# Patient Record
Sex: Male | Born: 1965
Health system: Southern US, Community
[De-identification: ages and names within clinical notes are randomized; demographics above are authoritative.]

## PROBLEM LIST (undated history)

## (undated) DIAGNOSIS — E785 Hyperlipidemia, unspecified: Secondary | ICD-10-CM

## (undated) DIAGNOSIS — Z86010 Personal history of colonic polyps: Secondary | ICD-10-CM

## (undated) HISTORY — DX: Gilbert syndrome: E80.4

## (undated) HISTORY — DX: Personal history of colonic polyps: Z86.010

## (undated) HISTORY — DX: Hyperlipidemia, unspecified: E78.5

---
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
## 1998-03-22 HISTORY — PX: VASECTOMY: SHX75

## 2009-08-28 ENCOUNTER — Ambulatory Visit: Payer: Self-pay | Admitting: Family Medicine

## 2009-08-30 DIAGNOSIS — E785 Hyperlipidemia, unspecified: Secondary | ICD-10-CM | POA: Insufficient documentation

## 2009-08-30 LAB — CONVERTED CEMR LAB
ALT: 41 units/L (ref 0–53)
AST: 28 units/L (ref 0–37)
Albumin: 4.6 g/dL (ref 3.5–5.2)
Alkaline Phosphatase: 52 units/L (ref 39–117)
BUN: 9 mg/dL (ref 6–23)
Basophils Absolute: 0 10*3/uL (ref 0.0–0.1)
Basophils Relative: 0.4 % (ref 0.0–3.0)
Bilirubin, Direct: 0.3 mg/dL (ref 0.0–0.3)
CO2: 31 meq/L (ref 19–32)
Calcium: 9.5 mg/dL (ref 8.4–10.5)
Chloride: 101 meq/L (ref 96–112)
Cholesterol: 233 mg/dL — ABNORMAL HIGH (ref 0–200)
Creatinine, Ser: 0.9 mg/dL (ref 0.4–1.5)
Direct LDL: 155.7 mg/dL
Eosinophils Absolute: 0.1 10*3/uL (ref 0.0–0.7)
Eosinophils Relative: 1.6 % (ref 0.0–5.0)
GFR calc non Af Amer: 96.82 mL/min (ref >60)
Glucose, Bld: 87 mg/dL (ref 70–99)
HCT: 44.9 % (ref 39.0–52.0)
HDL: 43.2 mg/dL (ref >39.00)
Hemoglobin: 16 g/dL (ref 13.0–17.0)
Lymphocytes Relative: 38.4 % (ref 12.0–46.0)
Lymphs Abs: 1.7 10*3/uL (ref 0.7–4.0)
MCHC: 35.8 g/dL (ref 30.0–36.0)
MCV: 90 fL (ref 78.0–100.0)
Monocytes Absolute: 0.3 10*3/uL (ref 0.1–1.0)
Monocytes Relative: 6.4 % (ref 3.0–12.0)
Neutro Abs: 2.4 10*3/uL (ref 1.4–7.7)
Neutrophils Relative %: 53.2 % (ref 43.0–77.0)
PSA: 0.46 ng/mL (ref 0.10–4.00)
Platelets: 144 10*3/uL — ABNORMAL LOW (ref 150.0–400.0)
Potassium: 3.9 meq/L (ref 3.5–5.1)
RBC: 4.98 M/uL (ref 4.22–5.81)
RDW: 12.6 % (ref 11.5–14.6)
Sodium: 141 meq/L (ref 135–145)
Total Bilirubin: 3 mg/dL — ABNORMAL HIGH (ref 0.3–1.2)
Total CHOL/HDL Ratio: 5
Total Protein: 6.8 g/dL (ref 6.0–8.3)
Triglycerides: 194 mg/dL — ABNORMAL HIGH (ref 0.0–149.0)
VLDL: 38.8 mg/dL (ref 0.0–40.0)
Vit D, 25-Hydroxy: 27 ng/mL — ABNORMAL LOW (ref 30–89)
WBC: 4.5 10*3/uL (ref 4.5–10.5)

## 2009-09-11 ENCOUNTER — Telehealth: Payer: Self-pay | Admitting: Family Medicine

## 2009-12-26 ENCOUNTER — Ambulatory Visit: Payer: Self-pay | Admitting: Family Medicine

## 2009-12-29 LAB — CONVERTED CEMR LAB
ALT: 76 units/L — ABNORMAL HIGH (ref 0–53)
AST: 45 units/L — ABNORMAL HIGH (ref 0–37)
Albumin: 4.2 g/dL (ref 3.5–5.2)
Alkaline Phosphatase: 52 units/L (ref 39–117)
Bilirubin, Direct: 0.3 mg/dL (ref 0.0–0.3)
Cholesterol: 168 mg/dL (ref 0–200)
HDL: 43.4 mg/dL (ref >39.00)
LDL Cholesterol: 92 mg/dL (ref 0–99)
Total Bilirubin: 2.2 mg/dL — ABNORMAL HIGH (ref 0.3–1.2)
Total CHOL/HDL Ratio: 4
Total Protein: 6.7 g/dL (ref 6.0–8.3)
Triglycerides: 164 mg/dL — ABNORMAL HIGH (ref 0.0–149.0)
VLDL: 32.8 mg/dL (ref 0.0–40.0)

## 2010-04-01 ENCOUNTER — Ambulatory Visit: Admit: 2010-04-01 | Payer: Self-pay | Admitting: Family Medicine

## 2010-04-07 ENCOUNTER — Other Ambulatory Visit: Payer: Self-pay | Admitting: Family Medicine

## 2010-04-07 ENCOUNTER — Ambulatory Visit
Admission: RE | Admit: 2010-04-07 | Discharge: 2010-04-07 | Payer: Self-pay | Source: Home / Self Care | Attending: Family Medicine | Admitting: Family Medicine

## 2010-04-07 LAB — LIPID PANEL
Cholesterol: 222 mg/dL — ABNORMAL HIGH (ref 0–200)
HDL: 35.6 mg/dL — ABNORMAL LOW (ref >39.00)
Total CHOL/HDL Ratio: 6
Triglycerides: 262 mg/dL — ABNORMAL HIGH (ref 0.0–149.0)
VLDL: 52.4 mg/dL — ABNORMAL HIGH (ref 0.0–40.0)

## 2010-04-07 LAB — ALT: ALT: 32 U/L (ref 0–53)

## 2010-04-07 LAB — AST: AST: 26 U/L (ref 0–37)

## 2010-04-07 LAB — LDL CHOLESTEROL, DIRECT: Direct LDL: 150.6 mg/dL

## 2010-04-21 NOTE — Assessment & Plan Note (Signed)
Summary: NEW PT  [facility]  CYD/per dr Umberto Pavek   Vital Signs:  Patient profile:   45 year old male Height:      72 inches Weight:      219.50 pounds BMI:     29.88 Temp:     97.8 degrees F oral Pulse rate:   80 / minute Pulse rhythm:   regular BP sitting:   104 / 80  (left arm) Cuff size:   regular  Vitals Entered By: Delilah Shan CMA Duncan Dull) (August 28, 2009 12:10 PM) CC: New Patient to Establish   History of Present Illness: 45 year old male:  New pt to est and for CPX  Preventive Screening-Counseling & Management  Alcohol-Tobacco     Alcohol drinks/day: 1     Alcohol Counseling: not indicated; use of alcohol is not excessive or problematic     Smoking Status: never     Tobacco Counseling: not indicated; no tobacco use  Caffeine-Diet-Exercise     Diet Counseling: to improve diet; diet is suboptimal     Does Patient Exercise: yes     Exercise Counseling: to improve exercise regimen  Hep-HIV-STD-Contraception     HIV Risk: no risk noted     STD Risk: no risk noted     Contraception Counseling: not indicated; no questions/concerns expressed     Dental Visit-last 6 months yes     Testicular SE Education/Counseling to perform regular STE      Sexual History:  currently monogamous.        Drug Use:  never and no.    Allergies (verified): No Known Drug Allergies  Past History:  Past Medical History: Hyperlipidemia  Family History: + Lipids, HTN  Social History: Married to [employer] Never Smoked Alcohol use-yes Drug use-no Regular exercise-yes Smoking Status:  never Drug Use:  never, no Does Patient Exercise:  yes HIV Risk:  no risk noted STD Risk:  no risk noted Dental Care w/in 6 mos.:  yes Sexual History:  currently monogamous  Review of Systems  General: Denies fever, chills, sweats, anorexia, fatigue, weakness, malaise Eyes: Denies blurring, vision loss ENT: Denies earache, nasal congestion, nosebleeds, sore throat, and  hoarseness.  Cardiovascular: Denies chest pains, palpitations, syncope, dyspnea on exertion,  Respiratory: Denies cough, dyspnea at rest, excessive sputum,wheeezing GI: Denies nausea, vomiting, diarrhea, constipation, change in bowel habits, abdominal pain, melena, hematochezia GU: Denies dysuria, hematuria, discharge, urinary frequency, urinary hesitancy, nocturia, incontinence, genital sores, decreased libido Musculoskeletal: Denies back pain, joint pain Derm: Denies rash, itching Neuro: Denies  paresthesias, frequent falls, frequent headaches, and difficulty walking.  Psych: Denies depression, anxiety Endocrine: Denies cold intolerance, heat intolerance, polydipsia, polyphagia, polyuria, and unusual weight change.  Heme: Denies enlarged lymph nodes Allergy: No hayfever   Otherwise, the pertinent positives and negatives are listed above and in the HPI, otherwise a full review of systems has been reviewed and is negative unless noted positive.    Impression & Recommendations:  Problem # 1:  HEALTH MAINTENANCE EXAM (ICD-V70.0) The patient's preventative maintenance and recommended screening tests for an annual wellness exam were reviewed in full today. Brought up to date unless services declined.  Counselled on the importance of diet, exercise, and its role in overall health and mortality. The patient's FH and SH was reviewed, including their home life, tobacco status, and drug and alcohol status.   Doing well. Work on [employer], diet, and exercise.  Other Orders: Venipuncture (81191) TLB-Lipid Panel (80061-LIPID) TLB-BMP (Basic Metabolic  Panel-BMET) (80048-METABOL) TLB-CBC Platelet - w/Differential (85025-CBCD) TLB-Hepatic/Liver Function Pnl (80076-HEPATIC) TLB-PSA (Prostate Specific Antigen) (84153-PSA) T-Vitamin D (25-Hydroxy) ([address]) Specimen Handling (09811)  Prior Medications (reviewed today): None Current Allergies (reviewed today): No known allergies   Physical  Exam General Appearance: well developed, well nourished, no acute distress Eyes: conjunctiva and lids normal, PERRLA, EOMI Ears, Nose, Mouth, Throat: TM clear, nares clear, oral exam WNL Neck: supple, no lymphadenopathy, no thyromegaly, no JVD Respiratory: clear to auscultation and percussion, respiratory effort normal Cardiovascular: regular rate and rhythm, S1-S2, no murmur, rub or gallop, no bruits, peripheral pulses normal and symmetric, no cyanosis, clubbing, edema or varicosities Chest: no scars, masses, tenderness; no asymmetry, skin changes, nipple discharge, no gynecomastia   Gastrointestinal: soft, non-tender; no hepatosplenomegaly, masses; active bowel sounds all quadrants,  no masses, tenderness, hemorrhoids  Genitourinary: no hernia, testicular mass, penile discharge, priapism or prostate enlargement Lymphatic: no cervical, axillary or inguinal adenopathy Musculoskeletal: gait normal, muscle tone and strength WNL, no joint swelling, effusions, discoloration, crepitus  Skin: clear, good turgor, color WNL, no rashes, lesions, or ulcerations Neurologic: normal mental status, normal reflexes, normal strength, sensation, and motion Psychiatric: alert; oriented to person, place and time Other Exam:

## 2010-04-21 NOTE — Progress Notes (Signed)
Summary: chol med   Phone Note Call from Patient Call back at Home Phone [phone]   Caller: Patient Call For: Hannah Beat MD Summary of Call: Patient called to let you know that he would like his cholesterol med sent to [facility].  Initial call taken by: Melody Comas,  September 11, 2009 10:17 AM  Follow-up for Phone Call        3 months will need lab appt FLP, HFP: 272.4 Follow-up by: Hannah Beat MD,  September 11, 2009 2:30 PM  Additional Follow-up for Phone Call Additional follow up Details #1::        appt made 12-16-2009 at 9:05 am Additional Follow-up by: Benny Lennert CMA Duncan Dull),  September 15, 2009 9:03 AM    New/Updated Medications: SIMVASTATIN 40 MG TABS (SIMVASTATIN) 1 by mouth at bedtime Prescriptions: SIMVASTATIN 40 MG TABS (SIMVASTATIN) 1 by mouth at bedtime  #30 x 5   Entered and Authorized by:   Hannah Beat MD   Signed by:   Hannah Beat MD on 09/11/2009   Method used:   Electronically to        [facility]* (retail)       [address]       [place], [place]  [id number]       Ph: [phone]       Fax: [phone]   RxID:   [id number]

## 2010-05-14 ENCOUNTER — Encounter: Payer: Self-pay | Admitting: Family Medicine

## 2010-05-14 ENCOUNTER — Ambulatory Visit (INDEPENDENT_AMBULATORY_CARE_PROVIDER_SITE_OTHER): Payer: 59 | Admitting: Family Medicine

## 2010-05-14 DIAGNOSIS — E785 Hyperlipidemia, unspecified: Secondary | ICD-10-CM

## 2010-05-19 NOTE — Assessment & Plan Note (Signed)
Summary: discuss cholesterol /alc   Vital Signs:  Patient profile:   45 year old male Weight:      222 pounds Temp:     98.8 degrees F oral Pulse rate:   66 / minute Pulse rhythm:   regular BP sitting:   110 / 68  (left arm) Cuff size:   large  Vitals Entered By: Mervin Hack CMA Duncan Dull) (May 14, 2010 9:18 AM)  CC: discuss Lipids   History of Present Illness:  45 yo f/u cholesterol  reviewed panel with him had to cycle off zocor, elevated LFT's, now returned to normal feeling well now. not at goal  REVIEW OF SYSTEMS GEN: No acute illnesses, no fever, chills, sweats. CV: No chest pain or SOB Otherwise, pertinent positives and negatives are noted in the HPI.   GEN: WDWN, NAD, Non-toxic, A & O x 3 HEENT: Atraumatic, Normocephalic. Neck supple. No masses, No LAD. Ears and Nose: No external deformity. CV: RRR, No M/G/R. No JVD. No thrill. No extra heart sounds. PULM: CTA B, no wheezes, crackles, rhonchi. No retractions. No resp. distress. No accessory muscle use. EXTR: No c/c/e NEURO: Normal gait.  PSYCH: Normally interactive. Conversant. Not depressed or anxious appearing.  Calm demeanor.    Clinical Review Panels:  Lipid Management   Cholesterol:  222 (04/07/2010)   LDL (bad choesterol):  92 (12/26/2009)   HDL (good cholesterol):  35.60 (04/07/2010)  Complete Metabolic Panel   Glucose:  87 (08/28/2009)   Sodium:  141 (08/28/2009)   Potassium:  3.9 (08/28/2009)   Chloride:  101 (08/28/2009)   CO2:  31 (08/28/2009)   BUN:  9 (08/28/2009)   Creatinine:  0.9 (08/28/2009)   Albumin:  4.2 (12/26/2009)   Total Protein:  6.7 (12/26/2009)   Calcium:  9.5 (08/28/2009)   Total Bili:  2.2 (12/26/2009)   Alk Phos:  52 (12/26/2009)   SGPT (ALT):  32 (04/07/2010)   SGOT (AST):  26 (04/07/2010)   Allergies: No Known Drug Allergies  Past History:  Past medical, surgical, family and social histories (including risk factors) reviewed, and no changes noted (except  as noted below).  Past Medical History: Reviewed history from 08/28/2009 and no changes required. Hyperlipidemia  Family History: Reviewed history from 08/28/2009 and no changes required. Family History Diabetes 1st degree relative, parents Family History High cholesterol, Parents, grandparents Family History Hypertension, parents and grandparents Heart Disease, grandparent Emotional/Mental Illness, parents  Social History: Reviewed history from 08/28/2009 and no changes required. Marital Status: Married Children:  Occupation: Art gallery manager Never Smoked Alcohol use-yes Drug use-no Regular exercise-no   Impression & Recommendations:  Problem # 1:  HYPERLIPIDEMIA (ICD-272.4)  start lower dose lipitor   The following medications were removed from the medication list:    Simvastatin 40 Mg Tabs (Simvastatin) .Marland Kitchen... 1 by mouth at bedtime His updated medication list for this problem includes:    Atorvastatin Calcium 20 Mg Tabs (Atorvastatin calcium) .Marland Kitchen... 1 by mouth at bedtime  Labs Reviewed: SGOT: 26 (04/07/2010)   SGPT: 32 (04/07/2010)   HDL:35.60 (04/07/2010), 43.40 (12/26/2009)  LDL:92 (12/26/2009)  Chol:222 (04/07/2010), 168 (12/26/2009)  Trig:262.0 (04/07/2010), 164.0 (12/26/2009)  Complete Medication List: 1)  Atorvastatin Calcium 20 Mg Tabs (Atorvastatin calcium) .Marland Kitchen.. 1 by mouth at bedtime  Patient Instructions: 1)  f/u lab visit: 2)  FLP, 272.4 3)  HFP: v58.69 Prescriptions: ATORVASTATIN CALCIUM 20 MG TABS (ATORVASTATIN CALCIUM) 1 by mouth at bedtime  #30 x 5   Entered and Authorized by:   Karleen Hampshire Butler Vegh  MD   Signed by:   Hannah Beat MD on 05/14/2010   Method used:   Electronically to        [facility]* (retail)       [address]       [place], [place]  [id number]       Ph: [phone]       Fax: [phone]   RxID:   [id number]    Orders Added: 1)  Est. Patient Level III [24401]    Prior Medications: Current Allergies  (reviewed today): No known allergies

## 2010-12-14 ENCOUNTER — Other Ambulatory Visit: Payer: Self-pay | Admitting: *Deleted

## 2010-12-14 MED ORDER — ATORVASTATIN CALCIUM 20 MG PO TABS: 20.0000 mg | ORAL_TABLET | Freq: Every day | ORAL | Status: DC

## 2010-12-17 ENCOUNTER — Encounter: Payer: Self-pay | Admitting: Family Medicine

## 2010-12-21 ENCOUNTER — Other Ambulatory Visit (INDEPENDENT_AMBULATORY_CARE_PROVIDER_SITE_OTHER): Payer: 59

## 2010-12-21 DIAGNOSIS — Z125 Encounter for screening for malignant neoplasm of prostate: Secondary | ICD-10-CM

## 2010-12-21 DIAGNOSIS — Z79899 Other long term (current) drug therapy: Secondary | ICD-10-CM

## 2010-12-21 DIAGNOSIS — E785 Hyperlipidemia, unspecified: Secondary | ICD-10-CM

## 2010-12-21 LAB — COMPREHENSIVE METABOLIC PANEL
Albumin: 4.5 g/dL (ref 3.5–5.2)
CO2: 30 mEq/L (ref 19–32)
Calcium: 9.2 mg/dL (ref 8.4–10.5)
GFR: 82.38 mL/min (ref >60.00)
Glucose, Bld: 95 mg/dL (ref 70–99)
Sodium: 141 mEq/L (ref 135–145)
Total Bilirubin: 2.8 mg/dL — ABNORMAL HIGH (ref 0.3–1.2)
Total Protein: 7.1 g/dL (ref 6.0–8.3)

## 2010-12-21 LAB — CBC WITH DIFFERENTIAL/PLATELET
Eosinophils Relative: 2.2 % (ref 0.0–5.0)
HCT: 44.3 % (ref 39.0–52.0)
Lymphocytes Relative: 35.8 % (ref 12.0–46.0)
Monocytes Relative: 6.2 % (ref 3.0–12.0)
Neutrophils Relative %: 55.4 % (ref 43.0–77.0)
Platelets: 133 10*3/uL — ABNORMAL LOW (ref 150.0–400.0)
WBC: 4.2 10*3/uL — ABNORMAL LOW (ref 4.5–10.5)

## 2010-12-21 LAB — PSA: PSA: 0.79 ng/mL (ref 0.10–4.00)

## 2010-12-21 LAB — LIPID PANEL
HDL: 44.2 mg/dL (ref >39.00)
Triglycerides: 93 mg/dL (ref 0.0–149.0)

## 2010-12-23 ENCOUNTER — Encounter: Payer: Self-pay | Admitting: Family Medicine

## 2010-12-23 ENCOUNTER — Ambulatory Visit (INDEPENDENT_AMBULATORY_CARE_PROVIDER_SITE_OTHER): Payer: 59 | Admitting: Family Medicine

## 2010-12-23 VITALS — BP 120/70 | HR 62 | Temp 97.9°F | Ht 72.0 in | Wt 219.1 lb

## 2010-12-23 DIAGNOSIS — Z0000 Encounter for general adult medical examination without abnormal findings: Secondary | ICD-10-CM

## 2010-12-23 MED ORDER — ATORVASTATIN CALCIUM 20 MG PO TABS: 20.0000 mg | ORAL_TABLET | Freq: Every day | ORAL | Status: DC

## 2010-12-23 NOTE — Patient Instructions (Signed)
Recheck liver panel in 6 months  Otherwise CPX in a year

## 2010-12-23 NOTE — Progress Notes (Signed)
Subjective:    Patient ID: Timothy Rollins, male    DOB: Oct 01, 1965, 45 y.o.   MRN: [id number]  HPI  Breslin Hemann, a 45 y.o. male presents today in the office for the following:    Preventative Health Maintenance Visit:  Health Maintenance Summary Reviewed and updated, unless pt declines services.  Tobacco History Reviewed. Alcohol: approx 1-3 a night, some variety, from 0 to 4  Exercise Habits: Some activity, rec at least 30 mins 5 times a week - minimal right now STD concerns: no risk or activity to increase risk Drug Use: None Encouraged self-testicular check  Health Maintenance  Topic Date Due  . Tetanus/tdap  04/11/1983  . Influenza Vaccine  12/21/2010    Labs reviewed with the patient.   Lipids:    Component Value Date/Time   CHOL 145 12/21/2010 1024   TRIG 93.0 12/21/2010 1024   HDL 44.20 12/21/2010 1024   LDLDIRECT 150.6 04/07/2010 0905   VLDL 18.6 12/21/2010 1024   CHOLHDL 3 12/21/2010 1024    CBC:    Component Value Date/Time   WBC 4.2* 12/21/2010 1024   HGB 15.3 12/21/2010 1024   HCT 44.3 12/21/2010 1024   PLT 133.0* 12/21/2010 1024   MCV 92.3 12/21/2010 1024   NEUTROABS 2.3 12/21/2010 1024   LYMPHSABS 1.5 12/21/2010 1024   MONOABS 0.3 12/21/2010 1024   EOSABS 0.1 12/21/2010 1024   BASOSABS 0.0 12/21/2010 1024    Basic Metabolic Panel:    Component Value Date/Time   NA 141 12/21/2010 1024   K 4.1 12/21/2010 1024   CL 104 12/21/2010 1024   CO2 30 12/21/2010 1024   BUN 15 12/21/2010 1024   CREATININE 1.0 12/21/2010 1024   GLUCOSE 95 12/21/2010 1024   CALCIUM 9.2 12/21/2010 1024    Lab Results  Component Value Date   ALT 56* 12/21/2010   AST 39* 12/21/2010   ALKPHOS 56 12/21/2010   BILITOT 2.8* 12/21/2010    Lab Results  Component Value Date   PSA 0.79 12/21/2010   PSA 0.46 08/28/2009   Doing well   The PMH, PSH, Social History, Family History, Medications, and allergies have been reviewed in [facility], and have been updated if relevant.   Review of  Systems  General: Denies fever, chills, sweats. No significant weight loss. Eyes: Denies blurring,significant itching ENT: Denies earache, sore throat, and hoarseness. Cardiovascular: Denies chest pains, palpitations, dyspnea on exertion Respiratory: Denies cough, dyspnea at rest,wheeezing Breast: no concerns about lumps GI: Denies nausea, vomiting, diarrhea, constipation, change in bowel habits, abdominal pain, melena, hematochezia GU: Denies penile discharge, ED, urinary flow / outflow problems. No STD concerns. Musculoskeletal: Denies back pain, joint pain Derm: Denies rash, itching Neuro: Denies  paresthesias, frequent falls, frequent headaches Psych: Denies depression, anxiety Endocrine: Denies cold intolerance, heat intolerance, polydipsia Heme: Denies enlarged lymph nodes Allergy: No hayfever     Objective:   Physical Exam   Physical Exam  Blood pressure 120/70, pulse 62, temperature 97.9 F (36.6 C), temperature source Oral, height 6' (1.829 m), weight 219 lb 1.9 oz (99.392 kg), SpO2 98.00%.  PE: GEN: well developed, well nourished, no acute distress Eyes: conjunctiva and lids normal, PERRLA, EOMI ENT: TM clear, nares clear, oral exam WNL Neck: supple, no lymphadenopathy, no thyromegaly, no JVD Pulm: clear to auscultation and percussion, respiratory effort normal CV: regular rate and rhythm, S1-S2, no murmur, rub or gallop, no bruits, peripheral pulses normal and symmetric, no cyanosis, clubbing, edema or varicosities Chest: no scars, masses, no  gynecomastia   GI: soft, non-tender; no hepatosplenomegaly, masses; active bowel sounds all quadrants GU: no hernia, testicular mass, penile discharge, priapism or prostate enlargement Lymph: no cervical, axillary or inguinal adenopathy MSK: gait normal, muscle tone and strength WNL, no joint swelling, effusions, discoloration, crepitus  SKIN: clear, good turgor, color WNL, no rashes, lesions, or ulcerations Neuro: normal  mental status, normal strength, sensation, and motion Psych: alert; oriented to person, place and time, normally interactive and not anxious or depressed in appearance.       Assessment & Plan:   1. Routine general medical examination at a health care facility    2. Elevated transaminase level  Hepatic function panel    The patient's preventative maintenance and recommended screening tests for an annual wellness exam were reviewed in full today. Brought up to date unless services declined.  Counselled on the importance of diet, exercise, and its role in overall health and mortality. The patient's FH and SH was reviewed, including their home life, tobacco status, and drug and alcohol status.  2. Lipids are at goal. Family history cardiac disease. I would continue with the Lipitor 20 mg. Minimally elevated AST and ALT. I have asked him to decrease his alcohol intake and we will recheck his liver function in 6 months.

## 2010-12-24 ENCOUNTER — Encounter: Payer: Self-pay | Admitting: Family Medicine

## 2011-06-30 ENCOUNTER — Other Ambulatory Visit: Payer: 59

## 2011-07-07 ENCOUNTER — Other Ambulatory Visit: Payer: 59

## 2011-07-21 ENCOUNTER — Other Ambulatory Visit (INDEPENDENT_AMBULATORY_CARE_PROVIDER_SITE_OTHER): Payer: 59

## 2011-07-21 LAB — HEPATIC FUNCTION PANEL
AST: 29 U/L (ref 0–37)
Alkaline Phosphatase: 44 U/L (ref 39–117)
Bilirubin, Direct: 0.3 mg/dL (ref 0.0–0.3)
Total Bilirubin: 2.5 mg/dL — ABNORMAL HIGH (ref 0.3–1.2)

## 2011-07-22 ENCOUNTER — Encounter: Payer: Self-pay | Admitting: *Deleted

## 2012-01-13 ENCOUNTER — Other Ambulatory Visit: Payer: Self-pay | Admitting: *Deleted

## 2012-01-13 MED ORDER — ATORVASTATIN CALCIUM 20 MG PO TABS: 20.0000 mg | ORAL_TABLET | Freq: Every day | ORAL | Status: DC

## 2012-11-14 ENCOUNTER — Other Ambulatory Visit: Payer: Self-pay

## 2012-11-14 NOTE — Telephone Encounter (Signed)
Pt left v/m has CPX scheduled 01/17/13; pt took last atorvastatin 11/13/12. Pt request refill until CPX; pt did not leave pharmacy. Left v/m for pt to cb.

## 2012-11-15 MED ORDER — ATORVASTATIN CALCIUM 20 MG PO TABS: 20.0000 mg | ORAL_TABLET | Freq: Every day | ORAL | Status: DC

## 2012-11-15 NOTE — Telephone Encounter (Signed)
Pt called back using [facility] advised pt done.

## 2013-01-17 ENCOUNTER — Encounter: Payer: Self-pay | Admitting: *Deleted

## 2013-01-17 ENCOUNTER — Ambulatory Visit (INDEPENDENT_AMBULATORY_CARE_PROVIDER_SITE_OTHER): Payer: 59 | Admitting: Family Medicine

## 2013-01-17 ENCOUNTER — Encounter: Payer: Self-pay | Admitting: Family Medicine

## 2013-01-17 VITALS — BP 110/74 | HR 56 | Temp 98.0°F | Ht 71.5 in | Wt 219.5 lb

## 2013-01-17 DIAGNOSIS — Z0000 Encounter for general adult medical examination without abnormal findings: Secondary | ICD-10-CM

## 2013-01-17 DIAGNOSIS — Z131 Encounter for screening for diabetes mellitus: Secondary | ICD-10-CM

## 2013-01-17 DIAGNOSIS — Z79899 Other long term (current) drug therapy: Secondary | ICD-10-CM

## 2013-01-17 DIAGNOSIS — E785 Hyperlipidemia, unspecified: Secondary | ICD-10-CM

## 2013-01-17 DIAGNOSIS — Z125 Encounter for screening for malignant neoplasm of prostate: Secondary | ICD-10-CM

## 2013-01-17 DIAGNOSIS — Z23 Encounter for immunization: Secondary | ICD-10-CM

## 2013-01-17 LAB — CBC WITH DIFFERENTIAL/PLATELET
Basophils Absolute: 0 10*3/uL (ref 0.0–0.1)
Basophils Relative: 0.4 % (ref 0.0–3.0)
Eosinophils Absolute: 0.1 10*3/uL (ref 0.0–0.7)
Eosinophils Relative: 1.5 % (ref 0.0–5.0)
HCT: 44.8 % (ref 39.0–52.0)
Hemoglobin: 15.8 g/dL (ref 13.0–17.0)
Lymphocytes Relative: 38.7 % (ref 12.0–46.0)
Lymphs Abs: 1.8 10*3/uL (ref 0.7–4.0)
MCHC: 35.3 g/dL (ref 30.0–36.0)
MCV: 89.1 fl (ref 78.0–100.0)
Monocytes Absolute: 0.3 10*3/uL (ref 0.1–1.0)
Neutro Abs: 2.4 10*3/uL (ref 1.4–7.7)
Neutrophils Relative %: 52.5 % (ref 43.0–77.0)
Platelets: 149 10*3/uL — ABNORMAL LOW (ref 150.0–400.0)
RBC: 5.03 Mil/uL (ref 4.22–5.81)
RDW: 12.6 % (ref 11.5–14.6)
WBC: 4.6 10*3/uL (ref 4.5–10.5)

## 2013-01-17 LAB — LIPID PANEL
Cholesterol: 162 mg/dL (ref 0–200)
HDL: 47.3 mg/dL (ref >39.00)
Total CHOL/HDL Ratio: 3
Triglycerides: 151 mg/dL — ABNORMAL HIGH (ref 0.0–149.0)

## 2013-01-17 LAB — BASIC METABOLIC PANEL
CO2: 31 mEq/L (ref 19–32)
Calcium: 9.6 mg/dL (ref 8.4–10.5)
Chloride: 101 mEq/L (ref 96–112)
Creatinine, Ser: 1 mg/dL (ref 0.4–1.5)
Glucose, Bld: 99 mg/dL (ref 70–99)

## 2013-01-17 LAB — PSA: PSA: 0.83 ng/mL (ref 0.10–4.00)

## 2013-01-17 LAB — HEPATIC FUNCTION PANEL
ALT: 44 U/L (ref 0–53)
Albumin: 4.5 g/dL (ref 3.5–5.2)
Alkaline Phosphatase: 49 U/L (ref 39–117)
Bilirubin, Direct: 0.3 mg/dL (ref 0.0–0.3)
Total Protein: 7.2 g/dL (ref 6.0–8.3)

## 2013-01-17 NOTE — Progress Notes (Signed)
Date:  01/17/2013   Name:  Timothy Rollins   DOB:  [birth year]   MRN:  [id number] Gender: male Age: 47 y.o.  Primary Physician:  Hannah Beat, MD   Chief Complaint: Annual Exam   History of Present Illness:  Timothy Rollins is a 47 y.o. very pleasant male patient who presents with the following:  Preventative Health Maintenance Visit:  Health Maintenance Summary Reviewed and updated, unless pt declines services.  Tobacco History Reviewed. Alcohol: No concerns, no excessive use Exercise Habits: Some activity, rec at least 30 mins 5 times a week STD concerns: no risk or activity to increase risk Drug Use: None Encouraged self-testicular check  Health Maintenance  Topic Date Due  . Tetanus/tdap  04/11/1983  . Influenza Vaccine  10/20/2012    Past Medical History, Surgical History, Social History, Family History, Problem List, Medications, and Allergies have been reviewed and updated if relevant.  Current Outpatient Prescriptions on File Prior to Visit  Medication Sig Dispense Refill  . atorvastatin (LIPITOR) 20 MG tablet Take 1 tablet (20 mg total) by mouth daily.  30 tablet  2   No current facility-administered medications on file prior to visit.    Review of Systems:  General: Denies fever, chills, sweats. No significant weight loss. Eyes: Denies blurring,significant itching ENT: Denies earache, sore throat, and hoarseness. Cardiovascular: Denies chest pains, palpitations, dyspnea on exertion Respiratory: Denies cough, dyspnea at rest,wheeezing Breast: no concerns about lumps GI: Denies nausea, vomiting, diarrhea, constipation, change in bowel habits, abdominal pain, melena, hematochezia GU: Denies penile discharge, ED, urinary flow / outflow problems. No STD concerns. Musculoskeletal: Denies back pain, joint pain Derm: Denies rash, itching Neuro: Denies  paresthesias, frequent falls, frequent headaches Psych: Denies depression, anxiety Endocrine: Denies cold  intolerance, heat intolerance, polydipsia Heme: Denies enlarged lymph nodes Allergy: No hayfever  Physical Examination: BP 110/74  Pulse 56  Temp(Src) 98 F (36.7 C) (Oral)  Ht 5' 11.5" (1.816 m)  Wt 219 lb 8 oz (99.565 kg)  BMI 30.19 kg/m2  GEN: well developed, well nourished, no acute distress Eyes: conjunctiva and lids normal, PERRLA, EOMI ENT: TM clear, nares clear, oral exam WNL Neck: supple, no lymphadenopathy, no thyromegaly, no JVD Pulm: clear to auscultation and percussion, respiratory effort normal CV: regular rate and rhythm, S1-S2, no murmur, rub or gallop, no bruits, peripheral pulses normal and symmetric, no cyanosis, clubbing, edema or varicosities GI: soft, non-tender; no hepatosplenomegaly, masses; active bowel sounds all quadrants GU: no hernia, testicular mass, penile discharge Lymph: no cervical, axillary or inguinal adenopathy MSK: gait normal, muscle tone and strength WNL, no joint swelling, effusions, discoloration, crepitus  SKIN: clear, good turgor, color WNL, no rashes, lesions, or ulcerations Neuro: normal mental status, normal strength, sensation, and motion Psych: alert; oriented to person, place and time, normally interactive and not anxious or depressed in appearance.  Assessment and Plan:  Routine general medical examination at a health care facility  Screening for diabetes mellitus - Plan: Basic metabolic panel  Special screening for malignant neoplasm of prostate - Plan: PSA  Other and unspecified hyperlipidemia - Plan: Lipid panel  Encounter for long-term (current) use of other medications - Plan: CBC with Differential, Hepatic function panel  The patient's preventative maintenance and recommended screening tests for an annual wellness exam were reviewed in full today. Brought up to date unless services declined.  Counselled on the importance of diet, exercise, and its role in overall health and mortality. The patient's FH and SH was  reviewed, including  their home life, tobacco status, and drug and alcohol status.   Doing well  Orders Today:  Orders Placed This Encounter  Procedures  . Basic metabolic panel  . CBC with Differential  . Hepatic function panel  . Lipid panel  . PSA    Updated Medication List: (Includes new medications, updates to list, dose adjustments) Meds ordered this encounter  Medications  . Multiple Vitamin (MULTIVITAMIN) tablet    Sig: Take 1 tablet by mouth daily.    Medications Discontinued: There are no discontinued medications.    Signed,  Elpidio Galea. Hayes Czaja, MD, CAQ Sports Medicine  [facility] at [facility] [address] [place] [address] Phone: [phone] Fax: [phone]

## 2013-01-17 NOTE — Addendum Note (Signed)
Addended by: Sydell Axon C on: 01/17/2013 10:30 AM   Modules accepted: Orders

## 2013-02-22 ENCOUNTER — Other Ambulatory Visit: Payer: Self-pay | Admitting: *Deleted

## 2013-02-22 MED ORDER — ATORVASTATIN CALCIUM 20 MG PO TABS: 20.0000 mg | ORAL_TABLET | Freq: Every day | ORAL | Status: DC

## 2013-03-01 ENCOUNTER — Ambulatory Visit (INDEPENDENT_AMBULATORY_CARE_PROVIDER_SITE_OTHER): Payer: 59 | Admitting: Family Medicine

## 2013-03-01 ENCOUNTER — Encounter: Payer: Self-pay | Admitting: Family Medicine

## 2013-03-01 VITALS — BP 120/80 | HR 56 | Temp 98.5°F | Ht 71.5 in | Wt 225.0 lb

## 2013-03-01 DIAGNOSIS — R058 Other specified cough: Secondary | ICD-10-CM | POA: Insufficient documentation

## 2013-03-01 DIAGNOSIS — R05 Cough: Secondary | ICD-10-CM

## 2013-03-01 NOTE — Progress Notes (Signed)
   Date:  03/01/2013   Name:  Timothy Rollins   DOB:  [birth year]   MRN:  [id number] Gender: male Age: 47 y.o.  Primary Physician:  Hannah Beat, MD   Chief Complaint: Cough   Subjective:   History of Present Illness:  Timothy Rollins is a 47 y.o. very pleasant male patient who presents with the following:  Has a significant dry cough.  Got over cold, but still has a prolonged dry cough.  No coughing at night.  Never been a smoker.  Has been getting a little better.   Past Medical History, Surgical History, Social History, Family History, Problem List, Medications, and Allergies have been reviewed and updated if relevant.  Review of Systems: ROS: GEN: Acute illness details above GI: Tolerating PO intake GU: maintaining adequate hydration and urination Pulm: No SOB Interactive and getting along well at home.  Otherwise, ROS is as per the HPI.   Objective:   Physical Examination: BP 120/80  Pulse 56  Temp(Src) 98.5 F (36.9 C) (Oral)  Ht 5' 11.5" (1.816 m)  Wt 225 lb (102.059 kg)  BMI 30.95 kg/m2   GEN: A and O x 3. WDWN. NAD.    ENT: Nose clear, ext NML.  No LAD.  No JVD.  TM's clear. Oropharynx clear.  PULM: Normal WOB, no distress. No crackles, wheezes, rhonchi. CV: RRR, no M/G/R, No rubs, No JVD.   EXT: warm and well-perfused, No c/c/e. PSYCH: Pleasant and conversant.    Laboratory and Imaging Data:  Assessment & Plan:    Post-viral cough syndrome  Reassured, i would do nothing, and i think will resolve.  Signed,  Elpidio Galea. Mohan Erven, MD, CAQ Sports Medicine  [facility] at [facility] [address] [place] [address] Phone: [phone] Fax: [phone]  Updated Complete Medication List:   Medication List       This list is accurate as of: 03/01/13  1:57 PM.  Always use your most recent med list.               atorvastatin 20 MG tablet  Commonly known as:  LIPITOR  Take 1 tablet (20 mg total) by mouth daily.     multivitamin tablet  Take 1 tablet by mouth daily.

## 2013-03-01 NOTE — Progress Notes (Signed)
Pre-visit discussion using our clinic review tool. No additional management support is needed unless otherwise documented below in the visit note.  

## 2013-09-20 ENCOUNTER — Other Ambulatory Visit: Payer: Self-pay | Admitting: *Deleted

## 2013-09-20 MED ORDER — ATORVASTATIN CALCIUM 20 MG PO TABS: 20.0000 mg | ORAL_TABLET | Freq: Every day | ORAL | Status: DC

## 2014-01-17 ENCOUNTER — Telehealth: Payer: Self-pay

## 2014-01-17 DIAGNOSIS — Z13220 Encounter for screening for lipoid disorders: Secondary | ICD-10-CM

## 2014-01-17 DIAGNOSIS — Z79899 Other long term (current) drug therapy: Secondary | ICD-10-CM

## 2014-01-17 DIAGNOSIS — Z125 Encounter for screening for malignant neoplasm of prostate: Secondary | ICD-10-CM

## 2014-01-17 MED ORDER — TERBINAFINE HCL 250 MG PO TABS: 250.0000 mg | ORAL_TABLET | Freq: Every day | ORAL | Status: DC

## 2014-01-17 NOTE — Telephone Encounter (Signed)
Gurshan notified as instructed by telephone.  He will call back to schedule his lab and CPE appointment once he checks his calendar.  Prescription sent in to [facility] as instructed by Dr. Lorelei Pont.  Future Lab Orders placed in Epic.

## 2014-01-17 NOTE — Telephone Encounter (Signed)
Ok  Terbinafine 250 mg daily, #30, 3 refills  He will need to come and get f/u blood work 1 month after starting it. I would do a physical a few weeks after blood work.  FLP,  Z13.220 Cbc with diff, HFP, BMET: Z79.899 PSA: Z12.5

## 2014-01-17 NOTE — Telephone Encounter (Signed)
Pt left v/m; pt saw Dr Lorelei Pont recently outside of office and pt was to call in re: to fungus on toe; pt request prescription. Pt request cb when sent to pharmacy. [employer].Please advise.

## 2014-05-13 ENCOUNTER — Telehealth: Payer: Self-pay | Admitting: *Deleted

## 2014-05-13 MED ORDER — ATORVASTATIN CALCIUM 20 MG PO TABS: 20.0000 mg | ORAL_TABLET | Freq: Every day | ORAL | Status: DC

## 2014-05-13 NOTE — Telephone Encounter (Signed)
Please call and schedule CPE with fasting labs with Dr. Lorelei Pont.

## 2014-05-16 NOTE — Telephone Encounter (Signed)
Labs 4/6 cpx 4/13 Pt aware

## 2014-05-16 NOTE — Telephone Encounter (Signed)
Left message asking pt to call office  °

## 2014-06-26 ENCOUNTER — Other Ambulatory Visit (INDEPENDENT_AMBULATORY_CARE_PROVIDER_SITE_OTHER): Payer: 59

## 2014-06-26 DIAGNOSIS — Z13220 Encounter for screening for lipoid disorders: Secondary | ICD-10-CM

## 2014-06-26 DIAGNOSIS — Z125 Encounter for screening for malignant neoplasm of prostate: Secondary | ICD-10-CM

## 2014-06-26 DIAGNOSIS — Z79899 Other long term (current) drug therapy: Secondary | ICD-10-CM

## 2014-06-26 LAB — BASIC METABOLIC PANEL
BUN: 11 mg/dL (ref 6–23)
CHLORIDE: 103 meq/L (ref 96–112)
CO2: 31 mEq/L (ref 19–32)
Calcium: 9.9 mg/dL (ref 8.4–10.5)
Creatinine, Ser: 1.08 mg/dL (ref 0.40–1.50)
GFR: 76.86 mL/min (ref >60.00)
GLUCOSE: 95 mg/dL (ref 70–99)
Potassium: 4.4 mEq/L (ref 3.5–5.1)
SODIUM: 139 meq/L (ref 135–145)

## 2014-06-26 LAB — HEPATIC FUNCTION PANEL
ALBUMIN: 4.4 g/dL (ref 3.5–5.2)
ALT: 40 U/L (ref 0–53)
AST: 33 U/L (ref 0–37)
Alkaline Phosphatase: 52 U/L (ref 39–117)
Bilirubin, Direct: 0.3 mg/dL (ref 0.0–0.3)
Total Bilirubin: 2.1 mg/dL — ABNORMAL HIGH (ref 0.2–1.2)
Total Protein: 6.8 g/dL (ref 6.0–8.3)

## 2014-06-26 LAB — CBC WITH DIFFERENTIAL/PLATELET
BASOS PCT: 0.2 % (ref 0.0–3.0)
Basophils Absolute: 0 10*3/uL (ref 0.0–0.1)
EOS PCT: 1.7 % (ref 0.0–5.0)
Eosinophils Absolute: 0.1 10*3/uL (ref 0.0–0.7)
HEMATOCRIT: 45.2 % (ref 39.0–52.0)
Hemoglobin: 15.7 g/dL (ref 13.0–17.0)
LYMPHS ABS: 1.7 10*3/uL (ref 0.7–4.0)
Lymphocytes Relative: 36.5 % (ref 12.0–46.0)
MCHC: 34.8 g/dL (ref 30.0–36.0)
MCV: 90.7 fl (ref 78.0–100.0)
Monocytes Absolute: 0.3 10*3/uL (ref 0.1–1.0)
Monocytes Relative: 7.1 % (ref 3.0–12.0)
Neutro Abs: 2.5 10*3/uL (ref 1.4–7.7)
Neutrophils Relative %: 54.5 % (ref 43.0–77.0)
PLATELETS: 145 10*3/uL — AB (ref 150.0–400.0)
RBC: 4.98 Mil/uL (ref 4.22–5.81)
RDW: 13 % (ref 11.5–15.5)
WBC: 4.5 10*3/uL (ref 4.0–10.5)

## 2014-06-26 LAB — LIPID PANEL
CHOL/HDL RATIO: 4
CHOLESTEROL: 165 mg/dL (ref 0–200)
HDL: 46.9 mg/dL (ref >39.00)
LDL CALC: 89 mg/dL (ref 0–99)
NonHDL: 118.1
TRIGLYCERIDES: 144 mg/dL (ref 0.0–149.0)
VLDL: 28.8 mg/dL (ref 0.0–40.0)

## 2014-06-26 LAB — PSA: PSA: 0.68 ng/mL (ref 0.10–4.00)

## 2014-07-03 ENCOUNTER — Ambulatory Visit (INDEPENDENT_AMBULATORY_CARE_PROVIDER_SITE_OTHER): Payer: 59 | Admitting: Family Medicine

## 2014-07-03 ENCOUNTER — Encounter: Payer: Self-pay | Admitting: Family Medicine

## 2014-07-03 VITALS — BP 100/80 | HR 57 | Temp 97.5°F | Ht 71.0 in | Wt 223.0 lb

## 2014-07-03 DIAGNOSIS — Z1211 Encounter for screening for malignant neoplasm of colon: Secondary | ICD-10-CM | POA: Diagnosis not present

## 2014-07-03 DIAGNOSIS — Z0000 Encounter for general adult medical examination without abnormal findings: Secondary | ICD-10-CM | POA: Diagnosis not present

## 2014-07-03 MED ORDER — TERBINAFINE HCL 250 MG PO TABS: 250.0000 mg | ORAL_TABLET | Freq: Every day | ORAL | Status: DC

## 2014-07-03 NOTE — Patient Instructions (Signed)

## 2014-07-03 NOTE — Progress Notes (Signed)
Dr. Frederico Hamman T. Briar Sword, MD, [facility] [place], [address] Phone: [phone] Fax: [phone]  07/03/2014  Patient: Timothy Rollins, MRN: [id number], DOB: 1965/06/28, 49 y.o.  Primary Physician:  Owens Loffler, MD  Chief Complaint: Annual Exam  Subjective:   Timothy Rollins is a 49 y.o. pleasant patient who presents with the following:  Preventative Health Maintenance Visit:  Health Maintenance Summary Reviewed and updated, unless pt declines services.  Tobacco History Reviewed. Alcohol: No concerns, no excessive use - 2 beers at night Exercise Habits: Some activity, rec at least 30 mins 5 times a week STD concerns: no risk or activity to increase risk Drug Use: None Encouraged self-testicular check  MGF, d/c MI, 56 P Uncle, d/c MI, drank, HTN, 3 Father, d/c cancer  Weight in (lb) to have BMI = 25: 178.9   Toenail fungus.   Colon.   Test 450-490.   Health Maintenance  Topic Date Due  . HIV Screening  04/11/1979  . COLONOSCOPY  04/10/2014  . INFLUENZA VACCINE  10/21/2014  . TETANUS/TDAP  01/18/2023   Immunization History  Administered Date(s) Administered  . Influenza,inj,Quad PF,36+ Mos 01/17/2013  . Tdap 01/17/2013   Patient Active Problem List   Diagnosis Date Noted  . Gilbert's syndrome   . HYPERLIPIDEMIA 08/30/2009   Past Medical History  Diagnosis Date  . Hyperlipidemia   . Gilbert's syndrome    No past surgical history on file. History   Social History  . Marital Status: Married    Spouse Name: N/A  . Number of Children: 3  . Years of Education: N/A   Occupational History  . engineer    Social History Main Topics  . Smoking status: Never Smoker   . Smokeless tobacco: Never Used  . Alcohol Use: Yes     Comment: daily- a couple of lite beers at night  . Drug Use: No  . Sexual Activity: Not on file   Other Topics Concern  . Not on file   Social History Narrative     Regular exercise-no         No family history on file. No Known Allergies  Medication list has been reviewed and updated.   General: Denies fever, chills, sweats. No significant weight loss. Eyes: Denies blurring,significant itching ENT: Denies earache, sore throat, and hoarseness. Cardiovascular: Denies chest pains, palpitations, dyspnea on exertion Respiratory: Denies cough, dyspnea at rest,wheeezing Breast: no concerns about lumps GI: Denies nausea, vomiting, diarrhea, constipation, change in bowel habits, abdominal pain, melena, hematochezia GU: Denies penile discharge, discusses erectile quality and libido, urinary flow / outflow problems. No STD concerns. Musculoskeletal: Denies back pain, joint pain Derm: Denies rash, itching Neuro: Denies  paresthesias, frequent falls, frequent headaches Psych: Denies depression, anxiety Endocrine: Denies cold intolerance, heat intolerance, polydipsia Heme: Denies enlarged lymph nodes Allergy: No hayfever  Objective:   BP 100/80 mmHg  Pulse 57  Temp(Src) 97.5 F (36.4 C) (Oral)  Ht '5\' 11"'  (1.803 m)  Wt 223 lb (101.152 kg)  BMI 31.12 kg/m2 Ideal Body Weight: Weight in (lb) to have BMI = 25: 178.9  No exam data present  GEN: well developed, well nourished, no acute distress Eyes: conjunctiva and lids normal, PERRLA, EOMI ENT: TM clear, nares clear, oral exam WNL Neck: supple, no lymphadenopathy, no thyromegaly, no JVD Pulm: clear to auscultation and percussion, respiratory effort normal CV: regular rate and rhythm, S1-S2, no murmur, rub or gallop, no bruits, peripheral pulses  normal and symmetric, no cyanosis, clubbing, edema or varicosities GI: soft, non-tender; no hepatosplenomegaly, masses; active bowel sounds all quadrants GU: no hernia, testicular mass, penile discharge Lymph: no cervical, axillary or inguinal adenopathy MSK: gait normal, muscle tone and strength WNL, no joint swelling, effusions, discoloration, crepitus   SKIN: clear, good turgor, color WNL, no rashes, lesions, or ulcerations Neuro: normal mental status, normal strength, sensation, and motion Psych: alert; oriented to person, place and time, normally interactive and not anxious or depressed in appearance. All labs reviewed with patient.  Lipids:    Component Value Date/Time   CHOL 165 06/26/2014 0921   TRIG 144.0 06/26/2014 0921   HDL 46.90 06/26/2014 0921   LDLDIRECT 150.6 04/07/2010 0905   VLDL 28.8 06/26/2014 0921   CHOLHDL 4 06/26/2014 0921   CBC: CBC Latest Ref Rng 06/26/2014 01/17/2013 12/21/2010  WBC 4.0 - 10.5 K/uL 4.5 4.6 4.2(L)  Hemoglobin 13.0 - 17.0 g/dL 15.7 15.8 15.3  Hematocrit 39.0 - 52.0 % 45.2 44.8 44.3  Platelets 150.0 - 400.0 K/uL 145.0(L) 149.0(L) 133.0(L)    Basic Metabolic Panel:    Component Value Date/Time   NA 139 06/26/2014 0921   K 4.4 06/26/2014 0921   CL 103 06/26/2014 0921   CO2 31 06/26/2014 0921   BUN 11 06/26/2014 0921   CREATININE 1.08 06/26/2014 0921   GLUCOSE 95 06/26/2014 0921   CALCIUM 9.9 06/26/2014 0921   Hepatic Function Latest Ref Rng 06/26/2014 01/17/2013 07/21/2011  Total Protein 6.0 - 8.3 g/dL 6.8 7.2 6.2  Albumin 3.5 - 5.2 g/dL 4.4 4.5 4.0  AST 0 - 37 U/L 33 32 29  ALT 0 - 53 U/L 40 44 42  Alk Phosphatase 39 - 117 U/L 52 49 44  Total Bilirubin 0.2 - 1.2 mg/dL 2.1(H) 3.5(H) 2.5(H)  Bilirubin, Direct 0.0 - 0.3 mg/dL 0.3 0.3 0.3    No results found for: TSH Lab Results  Component Value Date   PSA 0.68 06/26/2014   PSA 0.83 01/17/2013   PSA 0.79 12/21/2010    Assessment and Plan:   Routine general medical examination at a health care facility  Special screening for malignant neoplasms, colon - Plan: Ambulatory referral to Gastroenterology  Health Maintenance Exam: The patient's preventative maintenance and recommended screening tests for an annual wellness exam were reviewed in full today. Brought up to date unless services declined.  Counselled on the importance of  diet, exercise, and its role in overall health and mortality. The patient's FH and SH was reviewed, including their home life, tobacco status, and drug and alcohol status.  Doing well overall, answered questions.  Follow-up: Return in 1 year (on 07/03/2015). Unless noted, follow-up in 1 year for Health Maintenance Exam.  New Prescriptions   No medications on file   Orders Placed This Encounter  Procedures  . Ambulatory referral to Gastroenterology    Signed,  Frederico Hamman T. Zoye Chandra, MD   Patient's Medications  New Prescriptions   No medications on file  Previous Medications   ATORVASTATIN (LIPITOR) 20 MG TABLET    Take 1 tablet (20 mg total) by mouth daily.   MULTIPLE VITAMIN (MULTIVITAMIN) TABLET    Take 1 tablet by mouth daily.  Modified Medications   Modified Medication Previous Medication   TERBINAFINE (LAMISIL) 250 MG TABLET terbinafine (LAMISIL) 250 MG tablet      Take 1 tablet (250 mg total) by mouth daily.    Take 1 tablet (250 mg total) by mouth daily.  Discontinued Medications  No medications on file

## 2014-07-04 ENCOUNTER — Other Ambulatory Visit: Payer: Self-pay | Admitting: Family Medicine

## 2014-07-04 ENCOUNTER — Encounter: Payer: Self-pay | Admitting: Family Medicine

## 2014-09-09 ENCOUNTER — Telehealth: Payer: Self-pay

## 2014-09-09 MED ORDER — TRAZODONE HCL 50 MG PO TABS: ORAL_TABLET | ORAL | Status: DC

## 2014-09-09 NOTE — Telephone Encounter (Signed)
Patient notified as instructed by telephone and verbalized understanding. Rx called to pharmacy.

## 2014-09-09 NOTE — Telephone Encounter (Signed)
Call  Remind him that using another person's controlled substance is a felony.  I would recommend using a non-habit forming medication before using max dosage ambien.   Trial of Trazadone 50 mg, 1-2 tablets po qhs prn insomnia. #60, 1 refill

## 2014-09-09 NOTE — Telephone Encounter (Signed)
Pt left v/m; pt last annual exam 07/03/14 and discussed with Dr Lorelei Pont about insomnia and possibly starting [place];  A few nights ago pt took [place] 10 mg that his wife had and it worked well. Pt request ambien 10 mg to [facility]; pt request cb.

## 2014-09-13 NOTE — Telephone Encounter (Signed)
Pt left v/m; pt has been reading about Trazodone and understands is antidepressant; pt does not want to take antidepressant; pt wants to know if can get different med substituted for insomnia or should pt take OTC med for sleep. Pt request cb.[employer].

## 2014-09-14 NOTE — Telephone Encounter (Signed)
Butch Penny, can you call.  Though Trazadone was originally invented as an anti-depressant, it works very poorly as one. In my entire career, I have never seen anyone use it as an anti-depressant.   It does make you sleepy, though, and is used all the time for this. Preferred drug for insomnia by most psychiatrists. I think it is totally safe and many decades of safety records.    If he doesn't want to try that:  Melatonin 10 mg. 1 hour before bed.  AND  EITHER  Benadryl 50 mg 30 minutes before bed OR Unisom (doxylamine) 1 tablet 30 minutes before bed  That will work.  Electronically Signed  By: Owens Loffler, MD On: 09/14/2014 5:38 PM

## 2014-09-17 NOTE — Telephone Encounter (Signed)
Timothy Rollins notified as instructed by telephone.  He states to make sure Dr. Lorelei Pont knows he was not questioning his medical advise but just didn't want to be linked to taking an antidepressant.  He states he will probably give it a try and sees what happens.

## 2015-03-23 HISTORY — PX: POLYPECTOMY: SHX149

## 2015-03-23 HISTORY — PX: COLONOSCOPY: SHX174

## 2015-07-18 ENCOUNTER — Telehealth: Payer: Self-pay | Admitting: Family Medicine

## 2015-07-18 NOTE — Telephone Encounter (Signed)
Please call and schedule CPE with fasting labs for Dr. Lorelei Pont.

## 2015-07-22 NOTE — Telephone Encounter (Signed)
Left message asking pt to call office  °

## 2015-07-23 NOTE — Telephone Encounter (Signed)
Labs 8/25 cpx 8/30'pt aware

## 2015-11-14 ENCOUNTER — Other Ambulatory Visit (INDEPENDENT_AMBULATORY_CARE_PROVIDER_SITE_OTHER): Payer: 59

## 2015-11-14 DIAGNOSIS — E785 Hyperlipidemia, unspecified: Secondary | ICD-10-CM | POA: Diagnosis not present

## 2015-11-14 DIAGNOSIS — R7989 Other specified abnormal findings of blood chemistry: Secondary | ICD-10-CM

## 2015-11-14 DIAGNOSIS — Z125 Encounter for screening for malignant neoplasm of prostate: Secondary | ICD-10-CM

## 2015-11-14 DIAGNOSIS — Z79899 Other long term (current) drug therapy: Secondary | ICD-10-CM | POA: Diagnosis not present

## 2015-11-14 LAB — HEPATIC FUNCTION PANEL
ALT: 37 U/L (ref 0–53)
AST: 26 U/L (ref 0–37)
Albumin: 4.5 g/dL (ref 3.5–5.2)
Alkaline Phosphatase: 47 U/L (ref 39–117)
BILIRUBIN TOTAL: 2.7 mg/dL — AB (ref 0.2–1.2)
Bilirubin, Direct: 0.4 mg/dL — ABNORMAL HIGH (ref 0.0–0.3)
Total Protein: 6.8 g/dL (ref 6.0–8.3)

## 2015-11-14 LAB — PSA: PSA: 0.77 ng/mL (ref 0.10–4.00)

## 2015-11-14 LAB — CBC WITH DIFFERENTIAL/PLATELET
Basophils Absolute: 0 10*3/uL (ref 0.0–0.1)
Basophils Relative: 0.5 % (ref 0.0–3.0)
EOS PCT: 2.1 % (ref 0.0–5.0)
Eosinophils Absolute: 0.1 10*3/uL (ref 0.0–0.7)
HCT: 45.9 % (ref 39.0–52.0)
Hemoglobin: 16.4 g/dL (ref 13.0–17.0)
LYMPHS ABS: 1.7 10*3/uL (ref 0.7–4.0)
Lymphocytes Relative: 36.1 % (ref 12.0–46.0)
MCHC: 35.8 g/dL (ref 30.0–36.0)
MCV: 88.9 fl (ref 78.0–100.0)
MONOS PCT: 6.4 % (ref 3.0–12.0)
Monocytes Absolute: 0.3 10*3/uL (ref 0.1–1.0)
NEUTROS ABS: 2.6 10*3/uL (ref 1.4–7.7)
Neutrophils Relative %: 54.9 % (ref 43.0–77.0)
Platelets: 142 10*3/uL — ABNORMAL LOW (ref 150.0–400.0)
RBC: 5.16 Mil/uL (ref 4.22–5.81)
RDW: 12.8 % (ref 11.5–15.5)
WBC: 4.8 10*3/uL (ref 4.0–10.5)

## 2015-11-14 LAB — BASIC METABOLIC PANEL
BUN: 13 mg/dL (ref 6–23)
CHLORIDE: 102 meq/L (ref 96–112)
CO2: 32 meq/L (ref 19–32)
Calcium: 9.4 mg/dL (ref 8.4–10.5)
Creatinine, Ser: 1.06 mg/dL (ref 0.40–1.50)
GFR: 78.1 mL/min (ref >60.00)
GLUCOSE: 90 mg/dL (ref 70–99)
POTASSIUM: 4.2 meq/L (ref 3.5–5.1)
SODIUM: 141 meq/L (ref 135–145)

## 2015-11-14 LAB — LIPID PANEL
CHOL/HDL RATIO: 4
Cholesterol: 174 mg/dL (ref 0–200)
HDL: 47.8 mg/dL (ref >39.00)
NonHDL: 126.47
Triglycerides: 224 mg/dL — ABNORMAL HIGH (ref 0.0–149.0)
VLDL: 44.8 mg/dL — AB (ref 0.0–40.0)

## 2015-11-14 LAB — LDL CHOLESTEROL, DIRECT: Direct LDL: 95 mg/dL

## 2015-11-19 ENCOUNTER — Encounter: Payer: Self-pay | Admitting: Family Medicine

## 2015-11-19 ENCOUNTER — Ambulatory Visit (INDEPENDENT_AMBULATORY_CARE_PROVIDER_SITE_OTHER): Payer: 59 | Admitting: Family Medicine

## 2015-11-19 ENCOUNTER — Encounter: Payer: Self-pay | Admitting: Internal Medicine

## 2015-11-19 VITALS — BP 104/78 | HR 59 | Temp 98.4°F | Ht 70.75 in | Wt 219.5 lb

## 2015-11-19 DIAGNOSIS — Z1211 Encounter for screening for malignant neoplasm of colon: Secondary | ICD-10-CM | POA: Diagnosis not present

## 2015-11-19 DIAGNOSIS — Z0000 Encounter for general adult medical examination without abnormal findings: Secondary | ICD-10-CM | POA: Diagnosis not present

## 2015-11-19 NOTE — Patient Instructions (Signed)

## 2015-11-19 NOTE — Progress Notes (Signed)
Dr. Frederico Hamman T. Niranjan Rufener, MD, [facility] [place], [address] Phone: [phone] Fax: [phone]  11/19/2015  Patient: Timothy Rollins, MRN: [id number], DOB: 08-09-1965, 50 y.o.  Primary Physician:  Owens Loffler, MD   Chief Complaint  Patient presents with  . Annual Exam   Subjective:   Timothy Rollins is a 50 y.o. pleasant patient who presents with the following:  Preventative Health Maintenance Visit:  Health Maintenance Summary Reviewed and updated, unless pt declines services.  Tobacco History Reviewed. Alcohol: No concerns, no excessive use Exercise Habits: Working out again at the gym STD concerns: no risk or activity to increase risk Drug Use: None Encouraged self-testicular check  Health Maintenance  Topic Date Due  . HIV Screening  04/11/1979  . COLONOSCOPY  04/10/2014  . INFLUENZA VACCINE  06/16/2016 (Originally 10/21/2015)  . TETANUS/TDAP  01/18/2023   Immunization History  Administered Date(s) Administered  . Influenza,inj,Quad PF,36+ Mos 01/17/2013  . Tdap 01/17/2013   Patient Active Problem List   Diagnosis Date Noted  . Gilbert's syndrome   . HYPERLIPIDEMIA 08/30/2009   Past Medical History:  Diagnosis Date  . Gilbert's syndrome   . Hyperlipidemia    No past surgical history on file. Social History   Social History  . Marital status: Married    Spouse name: N/A  . Number of children: 3  . Years of education: N/A   Occupational History  . engineer    Social History Main Topics  . Smoking status: Never Smoker  . Smokeless tobacco: Never Used  . Alcohol use Yes     Comment: daily- a couple of lite beers at night  . Drug use: No  . Sexual activity: Not on file   Other Topics Concern  . Not on file   Social History Narrative   Regular exercise-no         No family history on file. No Known Allergies  Medication list has been reviewed and updated.   General:  Denies fever, chills, sweats. No significant weight loss. Eyes: Denies blurring,significant itching ENT: Denies earache, sore throat, and hoarseness. Cardiovascular: Denies chest pains, palpitations, dyspnea on exertion Respiratory: Denies cough, dyspnea at rest,wheeezing Breast: no concerns about lumps GI: Denies nausea, vomiting, diarrhea, constipation, change in bowel habits, abdominal pain, melena, hematochezia GU: Denies penile discharge, ED, urinary flow / outflow problems. No STD concerns. Musculoskeletal: Denies back pain, joint pain Derm: Denies rash, itching Neuro: Denies  paresthesias, frequent falls, frequent headaches Psych: Denies depression, anxiety Endocrine: Denies cold intolerance, heat intolerance, polydipsia Heme: Denies enlarged lymph nodes Allergy: No hayfever  Objective:   BP 104/78   Pulse (!) 59   Temp 98.4 F (36.9 C) (Oral)   Ht 5' 10.75" (1.797 m)   Wt 219 lb 8 oz (99.6 kg)   BMI 30.83 kg/m  Ideal Body Weight: Weight in (lb) to have BMI = 25: 177.6  No exam data present  GEN: well developed, well nourished, no acute distress Eyes: conjunctiva and lids normal, PERRLA, EOMI ENT: TM clear, nares clear, oral exam WNL Neck: supple, no lymphadenopathy, no thyromegaly, no JVD Pulm: clear to auscultation and percussion, respiratory effort normal CV: regular rate and rhythm, S1-S2, no murmur, rub or gallop, no bruits, peripheral pulses normal and symmetric, no cyanosis, clubbing, edema or varicosities GI: soft, non-tender; no hepatosplenomegaly, masses; active bowel sounds all quadrants GU: no hernia, testicular mass, penile discharge Lymph: no cervical, axillary or inguinal adenopathy  MSK: gait normal, muscle tone and strength WNL, no joint swelling, effusions, discoloration, crepitus  SKIN: clear, good turgor, color WNL, no rashes, lesions, or ulcerations Neuro: normal mental status, normal strength, sensation, and motion Psych: alert; oriented to  person, place and time, normally interactive and not anxious or depressed in appearance. All labs reviewed with patient.  Lipids:    Component Value Date/Time   CHOL 174 11/14/2015 0933   TRIG 224.0 (H) 11/14/2015 0933   HDL 47.80 11/14/2015 0933   LDLDIRECT 95.0 11/14/2015 0933   VLDL 44.8 (H) 11/14/2015 0933   CHOLHDL 4 11/14/2015 0933   CBC: CBC Latest Ref Rng & Units 11/14/2015 06/26/2014 01/17/2013  WBC 4.0 - 10.5 K/uL 4.8 4.5 4.6  Hemoglobin 13.0 - 17.0 g/dL 16.4 15.7 15.8  Hematocrit 39.0 - 52.0 % 45.9 45.2 44.8  Platelets 150.0 - 400.0 K/uL 142.0(L) 145.0(L) 149.0(L)    Basic Metabolic Panel:    Component Value Date/Time   NA 141 11/14/2015 0933   K 4.2 11/14/2015 0933   CL 102 11/14/2015 0933   CO2 32 11/14/2015 0933   BUN 13 11/14/2015 0933   CREATININE 1.06 11/14/2015 0933   GLUCOSE 90 11/14/2015 0933   CALCIUM 9.4 11/14/2015 0933   Hepatic Function Latest Ref Rng & Units 11/14/2015 06/26/2014 01/17/2013  Total Protein 6.0 - 8.3 g/dL 6.8 6.8 7.2  Albumin 3.5 - 5.2 g/dL 4.5 4.4 4.5  AST 0 - 37 U/L 26 33 32  ALT 0 - 53 U/L 37 40 44  Alk Phosphatase 39 - 117 U/L 47 52 49  Total Bilirubin 0.2 - 1.2 mg/dL 2.7(H) 2.1(H) 3.5(H)  Bilirubin, Direct 0.0 - 0.3 mg/dL 0.4(H) 0.3 0.3    No results found for: TSH Lab Results  Component Value Date   PSA 0.77 11/14/2015   PSA 0.68 06/26/2014   PSA 0.83 01/17/2013    Assessment and Plan:   Healthcare maintenance  Special screening for malignant neoplasms, colon - Plan: Ambulatory referral to Gastroenterology  Health Maintenance Exam: The patient's preventative maintenance and recommended screening tests for an annual wellness exam were reviewed in full today. Brought up to date unless services declined.  Counselled on the importance of diet, exercise, and its role in overall health and mortality. The patient's FH and SH was reviewed, including their home life, tobacco status, and drug and alcohol status.  Follow-up:  No Follow-up on file. Unless noted, follow-up in 1 year for Health Maintenance Exam.  Orders Placed This Encounter  Procedures  . Ambulatory referral to Gastroenterology    Signed,  Frederico Hamman T. Xolani Degracia, MD   Patient's Medications  New Prescriptions   No medications on file  Previous Medications   ATORVASTATIN (LIPITOR) 20 MG TABLET    TAKE 1 TABLET ONCE DAILY.   MULTIPLE VITAMIN (MULTIVITAMIN) TABLET    Take 1 tablet by mouth daily.  Modified Medications   No medications on file  Discontinued Medications   TERBINAFINE (LAMISIL) 250 MG TABLET    Take 1 tablet (250 mg total) by mouth daily.   TRAZODONE (DESYREL) 50 MG TABLET    Take 1-2 by mouth at bedtime as needed for insomnia

## 2015-11-19 NOTE — Progress Notes (Signed)
Pre visit review using our clinic review tool, if applicable. No additional management support is needed unless otherwise documented below in the visit note. 

## 2015-11-27 ENCOUNTER — Other Ambulatory Visit: Payer: Self-pay | Admitting: Family Medicine

## 2016-01-12 ENCOUNTER — Ambulatory Visit (AMBULATORY_SURGERY_CENTER): Payer: Self-pay | Admitting: *Deleted

## 2016-01-12 VITALS — Ht 72.0 in | Wt 221.2 lb

## 2016-01-12 DIAGNOSIS — Z1211 Encounter for screening for malignant neoplasm of colon: Secondary | ICD-10-CM

## 2016-01-12 NOTE — Progress Notes (Signed)
No allergies to eggs or soy. No problems with anesthesia.  Pt given Emmi instructions for colonoscopy  No oxygen use  No diet drug use  

## 2016-01-26 ENCOUNTER — Encounter: Payer: 59 | Admitting: Internal Medicine

## 2016-01-27 ENCOUNTER — Encounter: Payer: Self-pay | Admitting: Internal Medicine

## 2016-02-09 ENCOUNTER — Ambulatory Visit (AMBULATORY_SURGERY_CENTER): Payer: 59 | Admitting: Internal Medicine

## 2016-02-09 ENCOUNTER — Encounter: Payer: Self-pay | Admitting: Internal Medicine

## 2016-02-09 VITALS — BP 123/76 | HR 50 | Temp 98.0°F | Resp 11 | Ht 72.0 in | Wt 221.0 lb

## 2016-02-09 DIAGNOSIS — Z1212 Encounter for screening for malignant neoplasm of rectum: Secondary | ICD-10-CM | POA: Diagnosis not present

## 2016-02-09 DIAGNOSIS — Z1211 Encounter for screening for malignant neoplasm of colon: Secondary | ICD-10-CM

## 2016-02-09 DIAGNOSIS — D120 Benign neoplasm of cecum: Secondary | ICD-10-CM

## 2016-02-09 MED ORDER — SODIUM CHLORIDE 0.9 % IV SOLN: 500.0000 mL | INTRAVENOUS | Status: DC

## 2016-02-09 NOTE — Patient Instructions (Addendum)
   I found and removed one polyp today - it looks benign.  I will let you know pathology results and when to have another routine colonoscopy by mail.  I appreciate the opportunity to care for you. Gatha Mayer, MD, FACG  YOU HAD AN ENDOSCOPIC PROCEDURE TODAY AT [facility]:   Refer to the procedure report that was given to you for any specific questions about what was found during the examination.  If the procedure report does not answer your questions, please call your gastroenterologist to clarify.  If you requested that your care partner not be given the details of your procedure findings, then the procedure report has been included in a sealed envelope for you to review at your convenience later.  YOU SHOULD EXPECT: Some feelings of bloating in the abdomen. Passage of more gas than usual.  Walking can help get rid of the air that was put into your GI tract during the procedure and reduce the bloating. If you had a lower endoscopy (such as a colonoscopy or flexible sigmoidoscopy) you may notice spotting of blood in your stool or on the toilet paper. If you underwent a bowel prep for your procedure, you may not have a normal bowel movement for a few days.  Please Note:  You might notice some irritation and congestion in your nose or some drainage.  This is from the oxygen used during your procedure.  There is no need for concern and it should clear up in a day or so.  SYMPTOMS TO REPORT IMMEDIATELY:   Following lower endoscopy (colonoscopy or flexible sigmoidoscopy):  Excessive amounts of blood in the stool  Significant tenderness or worsening of abdominal pains  Swelling of the abdomen that is new, acute  Fever of 100F or higher   For urgent or emergent issues, a gastroenterologist can be reached at any hour by calling [phone].   DIET:  We do recommend a small meal at first, but then you may proceed to your regular diet.  Drink plenty of fluids but you  should avoid alcoholic beverages for 24 hours.  ACTIVITY:  You should plan to take it easy for the rest of today and you should NOT DRIVE or use heavy machinery until tomorrow (because of the sedation medicines used during the test).    FOLLOW UP: Our staff will call the number listed on your records the next business day following your procedure to check on you and address any questions or concerns that you may have regarding the information given to you following your procedure. If we do not reach you, we will leave a message.  However, if you are feeling well and you are not experiencing any problems, there is no need to return our call.  We will assume that you have returned to your regular daily activities without incident.  If any biopsies were taken you will be contacted by phone or by letter within the next 1-3 weeks.  Please call us at [phone] if you have not heard about the biopsies in 3 weeks.    SIGNATURES/CONFIDENTIALITY: You and/or your care partner have signed paperwork which will be entered into your electronic medical record.  These signatures attest to the fact that that the information above on your After Visit Summary has been reviewed and is understood.  Full responsibility of the confidentiality of this discharge information lies with you and/or your care-partner.

## 2016-02-09 NOTE — Progress Notes (Signed)
To recovery, report to [place], RN, VSS 

## 2016-02-09 NOTE — Progress Notes (Signed)
Called to room to assist during endoscopic procedure.  Patient ID and intended procedure confirmed with present staff. Received instructions for my participation in the procedure from the performing physician.Called to room to assist during endoscopic procedure.  Patient ID and intended procedure confirmed with present staff. Received instructions for my participation in the procedure from the performing physician. 

## 2016-02-09 NOTE — Op Note (Signed)
[facility] Patient Name: Timothy Rollins Procedure Date: 02/09/2016 10:41 AM MRN: [id number] Endoscopist: Gatha Mayer , MD Age: 50 Referring MD:  Date of Birth: [birth year] Gender: Male Account #: [id number] Procedure:                Colonoscopy Indications:              Screening for colorectal malignant neoplasm, This                            is the patient's first colonoscopy Medicines:                Propofol per Anesthesia, Monitored Anesthesia Care Procedure:                Pre-Anesthesia Assessment:                           - Prior to the procedure, a History and Physical                            was performed, and patient medications and                            allergies were reviewed. The patient's tolerance of                            previous anesthesia was also reviewed. The risks                            and benefits of the procedure and the sedation                            options and risks were discussed with the patient.                            All questions were answered, and informed consent                            was obtained. Prior Anticoagulants: The patient has                            taken no previous anticoagulant or antiplatelet                            agents. ASA Grade Assessment: II - A patient with                            mild systemic disease. After reviewing the risks                            and benefits, the patient was deemed in                            satisfactory condition to undergo the procedure.  After obtaining informed consent, the colonoscope                            was passed under direct vision. Throughout the                            procedure, the patient's blood pressure, pulse, and                            oxygen saturations were monitored continuously. The                            Model CF-HQ190L [id number] scope was introduced    through the anus and advanced to the the cecum,                            identified by appendiceal orifice and ileocecal                            valve. The colonoscopy was performed without                            difficulty. The patient tolerated the procedure                            well. The quality of the bowel preparation was                            good. The bowel preparation used was Miralax. The                            ileocecal valve, appendiceal orifice, and rectum                            were photographed. Scope In: 10:50:38 AM Scope Out: 11:06:36 AM Scope Withdrawal Time: 0 hours 13 minutes 17 seconds  Total Procedure Duration: 0 hours 15 minutes 58 seconds  Findings:                 The perianal and digital rectal examinations were                            normal. Pertinent negatives include normal prostate                            (size, shape, and consistency).                           A 6 mm polyp was found in the cecum. The polyp was                            flat. The polyp was removed with a cold snare.  Resection and retrieval were complete. Verification                            of patient identification for the specimen was                            done. Estimated blood loss was minimal.                           The entire examined colon appeared normal on direct                            and retroflexion views. Complications:            No immediate complications. Estimated Blood Loss:     Estimated blood loss: none. Impression:               - One 6 mm polyp in the cecum, removed with a cold                            snare. Resected and retrieved.                           - The entire examined colon is normal on direct and                            retroflexion views. Recommendation:           - Patient has a contact number available for                            emergencies. The signs and symptoms of  potential                            delayed complications were discussed with the                            patient. Return to normal activities tomorrow.                            Written discharge instructions were provided to the                            patient.                           - Resume previous diet.                           - Continue present medications.                           - Await pathology results.                           - Repeat colonoscopy is recommended. The  colonoscopy date will be determined after pathology                            results from today's exam become available for                            review. Gatha Mayer, MD 02/09/2016 11:20:12 AM This report has been signed electronically.

## 2016-02-10 ENCOUNTER — Telehealth: Payer: Self-pay | Admitting: *Deleted

## 2016-02-10 NOTE — Telephone Encounter (Signed)
  Follow up Call-  Call back number 02/09/2016  Post procedure Call Back phone  # [phone]  Permission to leave phone message Yes  Some recent data might be hidden     Patient questions:  Do you have a fever, pain , or abdominal swelling? No. Pain Score  0 *  Have you tolerated food without any problems? Yes.    Have you been able to return to your normal activities? yes  Do you have any questions about your discharge instructions: Diet   No. Medications  No. Follow up visit  No.  Do you have questions or concerns about your Care? No.  Actions: * If pain score is 4 or above: No action needed, pain <4.

## 2016-02-15 ENCOUNTER — Encounter: Payer: Self-pay | Admitting: Internal Medicine

## 2016-02-15 DIAGNOSIS — Z860100 Personal history of colon polyps, unspecified: Secondary | ICD-10-CM | POA: Insufficient documentation

## 2016-02-15 HISTORY — DX: Personal history of colonic polyps: Z86.010

## 2016-02-15 HISTORY — DX: Personal history of colon polyps, unspecified: Z86.0100

## 2016-02-15 NOTE — Progress Notes (Signed)
6 mm ssp Recall 2022

## 2016-12-31 ENCOUNTER — Other Ambulatory Visit: Payer: Self-pay | Admitting: Family Medicine

## 2017-02-08 ENCOUNTER — Telehealth: Payer: Self-pay | Admitting: Family Medicine

## 2017-02-08 ENCOUNTER — Other Ambulatory Visit: Payer: Self-pay | Admitting: Family Medicine

## 2017-02-08 NOTE — Telephone Encounter (Signed)
Copied from [facility] [id number]. Topic: Quick Communication - See Telephone Encounter >> Feb 08, 2017  8:54 AM Percell Belt A wrote: CRM for notification. See Telephone encounter for:  02/08/17.

## 2017-02-08 NOTE — Telephone Encounter (Signed)
Left message to find out whic med to refill

## 2017-03-17 DIAGNOSIS — L821 Other seborrheic keratosis: Secondary | ICD-10-CM | POA: Diagnosis not present

## 2017-03-30 ENCOUNTER — Encounter: Payer: 59 | Admitting: Family Medicine

## 2017-04-13 ENCOUNTER — Other Ambulatory Visit: Payer: Self-pay

## 2017-04-13 ENCOUNTER — Ambulatory Visit (INDEPENDENT_AMBULATORY_CARE_PROVIDER_SITE_OTHER): Payer: 59 | Admitting: Family Medicine

## 2017-04-13 ENCOUNTER — Encounter: Payer: Self-pay | Admitting: Family Medicine

## 2017-04-13 VITALS — BP 110/78 | HR 60 | Temp 97.7°F | Ht 71.0 in | Wt 219.0 lb

## 2017-04-13 DIAGNOSIS — Z125 Encounter for screening for malignant neoplasm of prostate: Secondary | ICD-10-CM | POA: Diagnosis not present

## 2017-04-13 DIAGNOSIS — Z0000 Encounter for general adult medical examination without abnormal findings: Secondary | ICD-10-CM

## 2017-04-13 LAB — CBC WITH DIFFERENTIAL/PLATELET
BASOS ABS: 0 10*3/uL (ref 0.0–0.1)
BASOS PCT: 0.4 % (ref 0.0–3.0)
EOS ABS: 0 10*3/uL (ref 0.0–0.7)
Eosinophils Relative: 1.1 % (ref 0.0–5.0)
HCT: 45.6 % (ref 39.0–52.0)
Hemoglobin: 16.1 g/dL (ref 13.0–17.0)
LYMPHS ABS: 1.3 10*3/uL (ref 0.7–4.0)
LYMPHS PCT: 32.6 % (ref 12.0–46.0)
MCHC: 35.3 g/dL (ref 30.0–36.0)
MCV: 89.9 fl (ref 78.0–100.0)
MONO ABS: 0.3 10*3/uL (ref 0.1–1.0)
Monocytes Relative: 8.3 % (ref 3.0–12.0)
NEUTROS PCT: 57.6 % (ref 43.0–77.0)
Neutro Abs: 2.4 10*3/uL (ref 1.4–7.7)
PLATELETS: 143 10*3/uL — AB (ref 150.0–400.0)
RBC: 5.07 Mil/uL (ref 4.22–5.81)
RDW: 13.1 % (ref 11.5–15.5)
WBC: 4.1 10*3/uL (ref 4.0–10.5)

## 2017-04-13 LAB — BASIC METABOLIC PANEL
BUN: 12 mg/dL (ref 6–23)
CALCIUM: 9.7 mg/dL (ref 8.4–10.5)
CO2: 28 mEq/L (ref 19–32)
CREATININE: 0.98 mg/dL (ref 0.40–1.50)
Chloride: 102 mEq/L (ref 96–112)
GFR: 85.03 mL/min (ref >60.00)
Glucose, Bld: 108 mg/dL — ABNORMAL HIGH (ref 70–99)
Potassium: 4.2 mEq/L (ref 3.5–5.1)
Sodium: 140 mEq/L (ref 135–145)

## 2017-04-13 LAB — HEPATIC FUNCTION PANEL
ALK PHOS: 56 U/L (ref 39–117)
ALT: 38 U/L (ref 0–53)
AST: 28 U/L (ref 0–37)
Albumin: 4.6 g/dL (ref 3.5–5.2)
BILIRUBIN TOTAL: 2.3 mg/dL — AB (ref 0.2–1.2)
Bilirubin, Direct: 0.4 mg/dL — ABNORMAL HIGH (ref 0.0–0.3)
TOTAL PROTEIN: 7 g/dL (ref 6.0–8.3)

## 2017-04-13 LAB — PSA: PSA: 1.36 ng/mL (ref 0.10–4.00)

## 2017-04-13 LAB — LIPID PANEL
Cholesterol: 172 mg/dL (ref 0–200)
HDL: 51.9 mg/dL (ref >39.00)
LDL Cholesterol: 84 mg/dL (ref 0–99)
NonHDL: 120.39
TRIGLYCERIDES: 183 mg/dL — AB (ref 0.0–149.0)
Total CHOL/HDL Ratio: 3
VLDL: 36.6 mg/dL (ref 0.0–40.0)

## 2017-04-13 NOTE — Progress Notes (Signed)
Dr. Frederico Hamman T. Danyl Deems, MD, [facility] [place], [address] Phone: [phone] Fax: [phone]  04/13/2017  Patient: Timothy Rollins, MRN: [id number], DOB: Mar 28, 1965, 52 y.o.  Primary Physician:  Owens Loffler, MD   Chief Complaint  Patient presents with  . Annual Exam   Subjective:   Timothy Rollins is a 52 y.o. pleasant patient who presents with the following:  Preventative Health Maintenance Visit:  Health Maintenance Summary Reviewed and updated, unless pt declines services.  Tobacco History Reviewed. Alcohol: No concerns, no excessive use Exercise Habits: walking most days and jogging 2 times a week STD concerns: no risk or activity to increase risk Drug Use: None Encouraged self-testicular check  Health Maintenance  Topic Date Due  . HIV Screening  04/11/1979  . INFLUENZA VACCINE  06/19/2017 (Originally 10/20/2016)  . COLONOSCOPY  02/08/2021  . TETANUS/TDAP  01/18/2023   Immunization History  Administered Date(s) Administered  . Influenza,inj,Quad PF,6+ Mos 01/17/2013  . Tdap 01/17/2013   Patient Active Problem List   Diagnosis Date Noted  . Hx of colonic polyp 02/15/2016  . Gilbert's syndrome   . HYPERLIPIDEMIA 08/30/2009   Past Medical History:  Diagnosis Date  . Gilbert's syndrome   . Hx of colonic polyp 02/15/2016  . Hyperlipidemia    Past Surgical History:  Procedure Laterality Date  . VASECTOMY  2000  . [place] EXTRACTION  1980   Social History   Socioeconomic History  . Marital status: Married    Spouse name: Not on file  . Number of children: 3  . Years of education: Not on file  . Highest education level: Not on file  Social Needs  . Financial resource strain: Not on file  . Food insecurity - worry: Not on file  . Food insecurity - inability: Not on file  . Transportation needs - medical: Not on file  . Transportation needs - non-medical: Not on file    Occupational History  . Occupation: Chief Financial Officer  Tobacco Use  . Smoking status: Never Smoker  . Smokeless tobacco: Never Used  Substance and Sexual Activity  . Alcohol use: Yes    Alcohol/week: 12.6 oz    Types: 21 Cans of beer per week  . Drug use: No  . Sexual activity: Not on file  Other Topics Concern  . Not on file  Social History Narrative   Regular exercise-no         Family History  Problem Relation Age of Onset  . Colon cancer Neg Hx   . Esophageal cancer Neg Hx   . Rectal cancer Neg Hx   . Stomach cancer Neg Hx    No Known Allergies  Medication list has been reviewed and updated.   General: Denies fever, chills, sweats. No significant weight loss. Eyes: Denies blurring,significant itching ENT: Denies earache, sore throat, and hoarseness. Cardiovascular: Denies chest pains, palpitations, dyspnea on exertion Respiratory: Denies cough, dyspnea at rest,wheeezing Breast: no concerns about lumps GI: Denies nausea, vomiting, diarrhea, constipation, change in bowel habits, abdominal pain, melena, hematochezia GU: Denies penile discharge, ED, urinary flow / outflow problems. No STD concerns. Musculoskeletal: Denies back pain, joint pain Derm: Denies rash, itching Neuro: Denies  paresthesias, frequent falls, frequent headaches Psych: Denies depression, anxiety Endocrine: Denies cold intolerance, heat intolerance, polydipsia Heme: Denies enlarged lymph nodes Allergy: No hayfever  Objective:   BP 110/78   Pulse 60   Temp 97.7 F (36.5 C) (Oral)   Ht  '5\' 11"'  (1.803 m)   Wt 219 lb (99.3 kg)   BMI 30.54 kg/m  Ideal Body Weight: Weight in (lb) to have BMI = 25: 178.9  No exam data present  GEN: well developed, well nourished, no acute distress Eyes: conjunctiva and lids normal, PERRLA, EOMI ENT: TM clear, nares clear, oral exam WNL Neck: supple, no lymphadenopathy, no thyromegaly, no JVD Pulm: clear to auscultation and percussion, respiratory effort normal CV:  regular rate and rhythm, S1-S2, no murmur, rub or gallop, no bruits, peripheral pulses normal and symmetric, no cyanosis, clubbing, edema or varicosities GI: soft, non-tender; no hepatosplenomegaly, masses; active bowel sounds all quadrants GU: no hernia, testicular mass, penile discharge Lymph: no cervical, axillary or inguinal adenopathy MSK: gait normal, muscle tone and strength WNL, no joint swelling, effusions, discoloration, crepitus  SKIN: clear, good turgor, color WNL, no rashes, lesions, or ulcerations Neuro: normal mental status, normal strength, sensation, and motion Psych: alert; oriented to person, place and time, normally interactive and not anxious or depressed in appearance. All labs reviewed with patient.  Lipids:    Component Value Date/Time   CHOL 174 11/14/2015 0933   TRIG 224.0 (H) 11/14/2015 0933   HDL 47.80 11/14/2015 0933   LDLDIRECT 95.0 11/14/2015 0933   VLDL 44.8 (H) 11/14/2015 0933   CHOLHDL 4 11/14/2015 0933   CBC: CBC Latest Ref Rng & Units 11/14/2015 06/26/2014 01/17/2013  WBC 4.0 - 10.5 K/uL 4.8 4.5 4.6  Hemoglobin 13.0 - 17.0 g/dL 16.4 15.7 15.8  Hematocrit 39.0 - 52.0 % 45.9 45.2 44.8  Platelets 150.0 - 400.0 K/uL 142.0(L) 145.0(L) 149.0(L)    Basic Metabolic Panel:    Component Value Date/Time   NA 141 11/14/2015 0933   K 4.2 11/14/2015 0933   CL 102 11/14/2015 0933   CO2 32 11/14/2015 0933   BUN 13 11/14/2015 0933   CREATININE 1.06 11/14/2015 0933   GLUCOSE 90 11/14/2015 0933   CALCIUM 9.4 11/14/2015 0933   Hepatic Function Latest Ref Rng & Units 11/14/2015 06/26/2014 01/17/2013  Total Protein 6.0 - 8.3 g/dL 6.8 6.8 7.2  Albumin 3.5 - 5.2 g/dL 4.5 4.4 4.5  AST 0 - 37 U/L 26 33 32  ALT 0 - 53 U/L 37 40 44  Alk Phosphatase 39 - 117 U/L 47 52 49  Total Bilirubin 0.2 - 1.2 mg/dL 2.7(H) 2.1(H) 3.5(H)  Bilirubin, Direct 0.0 - 0.3 mg/dL 0.4(H) 0.3 0.3    No results found for: TSH Lab Results  Component Value Date   PSA 0.77 11/14/2015   PSA  0.68 06/26/2014   PSA 0.83 01/17/2013    Assessment and Plan:   Healthcare maintenance - Plan: Basic metabolic panel, CBC with Differential/Platelet, Hepatic function panel, Lipid panel, PSA  Screening PSA (prostate specific antigen) - Plan: PSA  Health Maintenance Exam: The patient's preventative maintenance and recommended screening tests for an annual wellness exam were reviewed in full today. Brought up to date unless services declined.  Counselled on the importance of diet, exercise, and its role in overall health and mortality. The patient's FH and SH was reviewed, including their home life, tobacco status, and drug and alcohol status.  Follow-up in 1 year for physical exam or additional follow-up below.  Follow-up: No Follow-up on file. Or follow-up in 1 year if not noted.  Orders Placed This Encounter  Procedures  . Basic metabolic panel  . CBC with Differential/Platelet  . Hepatic function panel  . Lipid panel  . PSA    Signed,  Mahealani Sulak T. Garry Nicolini, MD   Allergies as of 04/13/2017   No Known Allergies     Medication List        Accurate as of 04/13/17  8:58 AM. Always use your most recent med list.          atorvastatin 20 MG tablet Commonly known as:  LIPITOR TAKE 1 TABLET ONCE DAILY.   multivitamin tablet Take 1 tablet by mouth daily.

## 2017-04-14 ENCOUNTER — Encounter: Payer: Self-pay | Admitting: *Deleted

## 2017-05-05 ENCOUNTER — Other Ambulatory Visit: Payer: Self-pay | Admitting: Family Medicine

## 2017-05-09 ENCOUNTER — Ambulatory Visit: Payer: 59 | Admitting: Family Medicine

## 2017-05-09 ENCOUNTER — Other Ambulatory Visit: Payer: Self-pay

## 2017-05-09 ENCOUNTER — Encounter: Payer: Self-pay | Admitting: Family Medicine

## 2017-05-09 ENCOUNTER — Ambulatory Visit (INDEPENDENT_AMBULATORY_CARE_PROVIDER_SITE_OTHER)
Admission: RE | Admit: 2017-05-09 | Discharge: 2017-05-09 | Disposition: A | Discharge status: Home / Self Care | Payer: 59 | Source: Ambulatory Visit | Attending: Family Medicine | Admitting: Family Medicine

## 2017-05-09 VITALS — BP 114/82 | HR 51 | Temp 97.7°F | Ht 71.0 in | Wt 220.5 lb

## 2017-05-09 DIAGNOSIS — M545 Low back pain, unspecified: Secondary | ICD-10-CM

## 2017-05-09 DIAGNOSIS — M47816 Spondylosis without myelopathy or radiculopathy, lumbar region: Secondary | ICD-10-CM | POA: Diagnosis not present

## 2017-05-09 MED ORDER — TIZANIDINE HCL 4 MG PO TABS
4.0000 mg | ORAL_TABLET | Freq: Every evening | ORAL | 2 refills | Status: AC
Start: 2017-05-09 — End: 2017-08-07

## 2017-05-09 MED ORDER — DICLOFENAC SODIUM 75 MG PO TBEC
75.0000 mg | DELAYED_RELEASE_TABLET | Freq: Two times a day (BID) | ORAL | 2 refills | Status: AC
Start: 2017-05-09 — End: 2017-08-07

## 2017-05-09 NOTE — Progress Notes (Signed)
Dr. Frederico Hamman T. Nea Gittens, MD, [facility] [place], [address] Phone: [phone] Fax: [phone]  05/09/2017  Patient: Timothy Rollins, MRN: [id number], DOB: [birth year], 52 y.o.  Primary Physician:  Owens Loffler, MD   Chief Complaint  Patient presents with  . Back Pain    Dull ache in low back   Subjective:   Timothy Rollins is a 52 y.o. very pleasant male patient who presents with the following: Back Pain  ongoing for approximately: 2 weeks The patient has had back pain before. The back pain is localized into the lumbar spine area. They also describe no radiculopathy.  No numbness or tingling. No bowel or bladder incontinence. No focal weakness. Prior interventions: none Physical therapy: No Chiropractic manipulations: No Acupuncture: No Osteopathic manipulation: No Heat or cold: Minimal effect  Tenderness - ? epididymis  Past Medical History, Surgical History, Family History, Medications, Allergies have been reviewed and updated if relevant.  Patient Active Problem List   Diagnosis Date Noted  . Hx of colonic polyp 02/15/2016  . Gilbert's syndrome   . HYPERLIPIDEMIA 08/30/2009    Past Medical History:  Diagnosis Date  . Gilbert's syndrome   . Hx of colonic polyp 02/15/2016  . Hyperlipidemia     Past Surgical History:  Procedure Laterality Date  . VASECTOMY  2000  . [place] EXTRACTION  1980    Social History   Socioeconomic History  . Marital status: Married    Spouse name: Not on file  . Number of children: 3  . Years of education: Not on file  . Highest education level: Not on file  Social Needs  . Financial resource strain: Not on file  . Food insecurity - worry: Not on file  . Food insecurity - inability: Not on file  . Transportation needs - medical: Not on file  . Transportation needs - non-medical: Not on file  Occupational History  . Occupation: Chief Financial Officer  Tobacco Use   . Smoking status: Never Smoker  . Smokeless tobacco: Never Used  Substance and Sexual Activity  . Alcohol use: Yes    Alcohol/week: 12.6 oz    Types: 21 Cans of beer per week  . Drug use: No  . Sexual activity: Not on file  Other Topics Concern  . Not on file  Social History Narrative   Regular exercise-no          Family History  Problem Relation Age of Onset  . Colon cancer Neg Hx   . Esophageal cancer Neg Hx   . Rectal cancer Neg Hx   . Stomach cancer Neg Hx     No Known Allergies  Medication list reviewed and updated in full in [facility].  GEN: No fevers, chills. Nontoxic. Primarily MSK c/o today. MSK: Detailed in the HPI GI: tolerating PO intake without difficulty Neuro: As above  Otherwise the pertinent positives of the ROS are noted above.    Objective:   Blood pressure 114/82, pulse (!) 51, temperature 97.7 F (36.5 C), temperature source Oral, height 5\' 11"  (1.803 m), weight 220 lb 8 oz (100 kg).  Gen: Well-developed,well-nourished,in no acute distress; alert,appropriate and cooperative throughout examination HEENT: Normocephalic and atraumatic without obvious abnormalities.  Ears, externally no deformities Pulm: Breathing comfortably in no respiratory distress  GU: normal testicular exam. No tenderness.   Range of motion at  the waist: Flexion, rotation and lateral bending: normal  No echymosis or edema Rises to  examination table with no difficulty Gait: minimally antalgic  Inspection/Deformity: No abnormality Paraspinus T:  Mild l5-s1 b tenderness  B Ankle Dorsiflexion (L5,4): 5/5 B Great Toe Dorsiflexion (L5,4): 5/5 Heel Walk (L5): WNL Toe Walk (S1): WNL Rise/Squat (L4): WNL, mild pain  SENSORY B Medial Foot (L4): WNL B Dorsum (L5): WNL B Lateral (S1): WNL Light Touch: WNL Pinprick: WNL  REFLEXES Knee (L4): 2+ Ankle (S1): 2+  B SLR, seated: neg B SLR, supine: neg B FABER: neg B Reverse FABER: neg B Greater Troch: NT B  Log Roll: neg B Stork: NT B Sciatic Notch: NT  Radiology: Dg Lumbar Spine Complete  Result Date: 05/09/2017 CLINICAL DATA:  Patient states that his lower back has been hurting for two weeks. No injury. EXAM: LUMBAR SPINE - COMPLETE 4+ VIEW COMPARISON:  None. FINDINGS: Normal alignment. There is degenerative change with disc height loss and osteophyte formation at L1-2 and L2-3. Facet hypertrophy identified at the same levels. No acute fracture or subluxation. Regional bowel gas pattern is nonobstructive. IMPRESSION: Degenerative changes.  No evidence for acute  abnormality. Electronically Signed   By: Nolon Nations M.D.   On: 05/09/2017 12:20    Assessment and Plan:   Acute midline low back pain without sciatica - Plan: DG Lumbar Spine Complete  Anatomy reviewed. Conservative algorithms for acute back pain generally begin with the following: NSAIDS, Muscle Relaxants, Mild pain medication  Start with medications, core rehab, and progress from there following low back pain algorithm. No red flags are present.  Rare discomfort at epididymis - rec more frequent ejaculation. No sign of infection.   Follow-up: No Follow-up on file.  Meds ordered this encounter  Medications  . diclofenac (VOLTAREN) 75 MG EC tablet    Sig: Take 1 tablet (75 mg total) by mouth 2 (two) times daily.    Dispense:  60 tablet    Refill:  2  . tiZANidine (ZANAFLEX) 4 MG tablet    Sig: Take 1 tablet (4 mg total) by mouth Nightly.    Dispense:  30 tablet    Refill:  2   Orders Placed This Encounter  Procedures  . DG Lumbar Spine Complete    Signed,  Madesyn Ast T. Sal Spratley, MD   Allergies as of 05/09/2017   No Known Allergies     Medication List        Accurate as of 05/09/17  1:01 PM. Always use your most recent med list.          atorvastatin 20 MG tablet Commonly known as:  LIPITOR TAKE 1 TABLET ONCE DAILY.   diclofenac 75 MG EC tablet Commonly known as:  VOLTAREN Take 1 tablet (75 mg  total) by mouth 2 (two) times daily.   multivitamin tablet Take 1 tablet by mouth daily.   tiZANidine 4 MG tablet Commonly known as:  ZANAFLEX Take 1 tablet (4 mg total) by mouth Nightly.

## 2017-05-11 ENCOUNTER — Ambulatory Visit: Payer: 59 | Admitting: Family Medicine

## 2018-04-17 NOTE — Progress Notes (Signed)
Dr. Frederico Hamman T. Kwan Shellhammer, MD, [facility] Primary [facility] and Sports Medicine [place], [address] Phone: [phone] Fax: [phone]  04/19/2018  Patient: Timothy Rollins, MRN: [id number], DOB: [birth year], 53 y.o.  Primary Physician:  Owens Loffler, MD   Chief Complaint  Patient presents with  . Annual Exam   Subjective:   [name] is a 53 y.o. pleasant patient who presents with the following:  Preventative Health Maintenance Visit:  Health Maintenance Summary Reviewed and updated, unless pt declines services.  Tobacco History Reviewed. Alcohol: 2 michelob ultra at night Exercise Habits: Some activity, rec at least 30 mins 5 times a week STD concerns: no risk or activity to increase risk Drug Use: None Encouraged self-testicular check  L shoulder has been bother him.  Chainsaw.  Big tree that died.  Had some pain in the anterior shoulder.  6 weeks ongoing and bothering him some.   Saralyn Pilar graduated 2 years ago.  [name] will be a senior in the fall.   Flu shot  MGF died at 47 from a MI P Uncle died from MI at 34  Wt Readings from Last 3 Encounters:  04/19/18 221 lb (100.2 kg)  05/09/17 220 lb 8 oz (100 kg)  04/13/17 219 lb (99.3 kg)    Health Maintenance  Topic Date Due  . HIV Screening  04/11/1979  . INFLUENZA VACCINE  06/20/2018 (Originally 10/20/2017)  . COLONOSCOPY  02/08/2021  . TETANUS/TDAP  01/18/2023   Immunization History  Administered Date(s) Administered  . Influenza,inj,Quad PF,6+ Mos 01/17/2013  . Tdap 01/17/2013   Patient Active Problem List   Diagnosis Date Noted  . Hx of colonic polyp 02/15/2016  . Gilbert's syndrome   . HYPERLIPIDEMIA 08/30/2009   Past Medical History:  Diagnosis Date  . Gilbert's syndrome   . Hx of colonic polyp 02/15/2016  . Hyperlipidemia    Past Surgical History:  Procedure Laterality Date  . VASECTOMY  2000  . [place] EXTRACTION  1980   Social History    Socioeconomic History  . Marital status: Married    Spouse name: Not on file  . Number of children: 3  . Years of education: Not on file  . Highest education level: Not on file  Occupational History  . Occupation: Chief Financial Officer  Social Needs  . Financial resource strain: Not on file  . Food insecurity:    Worry: Not on file    Inability: Not on file  . Transportation needs:    Medical: Not on file    Non-medical: Not on file  Tobacco Use  . Smoking status: Never Smoker  . Smokeless tobacco: Never Used  Substance and Sexual Activity  . Alcohol use: Yes    Alcohol/week: 21.0 standard drinks    Types: 21 Cans of beer per week  . Drug use: No  . Sexual activity: Not on file  Lifestyle  . Physical activity:    Days per week: Not on file    Minutes per session: Not on file  . Stress: Not on file  Relationships  . Social connections:    Talks on phone: Not on file    Gets together: Not on file    Attends religious service: Not on file    Active member of club or organization: Not on file    Attends meetings of clubs or organizations: Not on file    Relationship status: Not on file  . Intimate partner violence:    Fear of  current or ex partner: Not on file    Emotionally abused: Not on file    Physically abused: Not on file    Forced sexual activity: Not on file  Other Topics Concern  . Not on file  Social History Narrative   Regular exercise-no         Family History  Problem Relation Age of Onset  . Colon cancer Neg Hx   . Esophageal cancer Neg Hx   . Rectal cancer Neg Hx   . Stomach cancer Neg Hx    No Known Allergies  Medication list has been reviewed and updated.   General: Denies fever, chills, sweats. No significant weight loss. Eyes: Denies blurring,significant itching ENT: Denies earache, sore throat, and hoarseness. Cardiovascular: Denies chest pains, palpitations, dyspnea on exertion Respiratory: Denies cough, dyspnea at rest,wheeezing Breast: no  concerns about lumps GI: Denies nausea, vomiting, diarrhea, constipation, change in bowel habits, abdominal pain, melena, hematochezia GU: Denies penile discharge, ED, urinary flow / outflow problems. No STD concerns. Musculoskeletal: Denies back pain, joint pain Derm: Denies rash, itching Neuro: Denies  paresthesias, frequent falls, frequent headaches Psych: Denies depression, anxiety Endocrine: Denies cold intolerance, heat intolerance, polydipsia Heme: Denies enlarged lymph nodes Allergy: No hayfever  Objective:   BP 100/78   Pulse (!) 57   Temp 98.3 F (36.8 C) (Oral)   Ht 5' 11.25" (1.81 m)   Wt 221 lb (100.2 kg)   BMI 30.61 kg/m  Ideal Body Weight: Weight in (lb) to have BMI = 25: 180.1  No exam data present  GEN: well developed, well nourished, no acute distress Eyes: conjunctiva and lids normal, PERRLA, EOMI ENT: TM clear, nares clear, oral exam WNL Neck: supple, no lymphadenopathy, no thyromegaly, no JVD Pulm: clear to auscultation and percussion, respiratory effort normal CV: regular rate and rhythm, S1-S2, no murmur, rub or gallop, no bruits, peripheral pulses normal and symmetric, no cyanosis, clubbing, edema or varicosities GI: soft, non-tender; no hepatosplenomegaly, masses; active bowel sounds all quadrants GU: no hernia, testicular mass, penile discharge Lymph: no cervical, axillary or inguinal adenopathy  Shoulder: L Inspection: No muscle wasting or winging Ecchymosis/edema: neg  AC joint, scapula, clavicle: NT Cervical spine: NT, full ROM Spurling's: neg Abduction: full, 5/5 Flexion: full, 5/5 IR, full, lift-off: 5/5 ER at neutral: full, 5/5 AC crossover: neg Neer: pos Hawkins: pos Drop Test: neg Empty Can: pos Supraspinatus insertion: mild-mod T Bicipital groove: NT Speed's: neg Yergason's: neg Sulcus sign: neg Scapular dyskinesis: none C5-T1 intact  Neuro: Sensation intact Grip 5/5   SKIN: clear, good turgor, color WNL, no rashes,  lesions, or ulcerations Neuro: normal mental status, normal strength, sensation, and motion Psych: alert; oriented to person, place and time, normally interactive and not anxious or depressed in appearance.  All labs reviewed with patient.  Lipids: Lab Results  Component Value Date   CHOL 172 04/13/2017   Lab Results  Component Value Date   HDL 51.90 04/13/2017   Lab Results  Component Value Date   LDLCALC 84 04/13/2017   Lab Results  Component Value Date   TRIG 183.0 (H) 04/13/2017   Lab Results  Component Value Date   CHOLHDL 3 04/13/2017   CBC: CBC Latest Ref Rng & Units 04/13/2017 11/14/2015 06/26/2014  WBC 4.0 - 10.5 K/uL 4.1 4.8 4.5  Hemoglobin 13.0 - 17.0 g/dL 16.1 16.4 15.7  Hematocrit 39.0 - 52.0 % 45.6 45.9 45.2  Platelets 150.0 - 400.0 K/uL 143.0(L) 142.0(L) 145.0(L)    Basic Metabolic  Panel:    Component Value Date/Time   NA 140 04/13/2017 0859   K 4.2 04/13/2017 0859   CL 102 04/13/2017 0859   CO2 28 04/13/2017 0859   BUN 12 04/13/2017 0859   CREATININE 0.98 04/13/2017 0859   GLUCOSE 108 (H) 04/13/2017 0859   CALCIUM 9.7 04/13/2017 0859   Hepatic Function Latest Ref Rng & Units 04/13/2017 11/14/2015 06/26/2014  Total Protein 6.0 - 8.3 g/dL 7.0 6.8 6.8  Albumin 3.5 - 5.2 g/dL 4.6 4.5 4.4  AST 0 - 37 U/L 28 26 33  ALT 0 - 53 U/L 38 37 40  Alk Phosphatase 39 - 117 U/L 56 47 52  Total Bilirubin 0.2 - 1.2 mg/dL 2.3(H) 2.7(H) 2.1(H)  Bilirubin, Direct 0.0 - 0.3 mg/dL 0.4(H) 0.4(H) 0.3    No results found for: HGBA1C No results found for: TSH Lab Results  Component Value Date   PSA 1.36 04/13/2017   PSA 0.77 11/14/2015   PSA 0.68 06/26/2014    Assessment and Plan:   Healthcare maintenance   Mild RTC symptoms - [facility] rehab reviewed. Continue to work on [employer] and fitness  We discussed potentially doing a coronary calcium score at some point with f hx, asymptomatic now  Health Maintenance Exam: The patient's preventative maintenance and  recommended screening tests for an annual wellness exam were reviewed in full today. Brought up to date unless services declined.  Counselled on the importance of diet, exercise, and its role in overall health and mortality. The patient's FH and SH was reviewed, including their home life, tobacco status, and drug and alcohol status.  Follow-up in 1 year for physical exam or additional follow-up below.  Follow-up: No follow-ups on file. Or follow-up in 1 year if not noted.  Signed,  Maud Deed. Anjali Manzella, MD   Allergies as of 04/19/2018   No Known Allergies     Medication List       Accurate as of April 19, 2018  8:27 AM. Always use your most recent med list.        atorvastatin 20 MG tablet Commonly known as:  LIPITOR TAKE 1 TABLET ONCE DAILY.   multivitamin tablet Take 1 tablet by mouth daily.

## 2018-04-19 ENCOUNTER — Ambulatory Visit (INDEPENDENT_AMBULATORY_CARE_PROVIDER_SITE_OTHER): Payer: 59 | Admitting: Family Medicine

## 2018-04-19 ENCOUNTER — Encounter: Payer: Self-pay | Admitting: Family Medicine

## 2018-04-19 VITALS — BP 100/78 | HR 57 | Temp 98.3°F | Ht 71.25 in | Wt 221.0 lb

## 2018-04-19 DIAGNOSIS — Z0000 Encounter for general adult medical examination without abnormal findings: Secondary | ICD-10-CM | POA: Diagnosis not present

## 2018-04-19 DIAGNOSIS — Z23 Encounter for immunization: Secondary | ICD-10-CM

## 2018-04-19 DIAGNOSIS — Z131 Encounter for screening for diabetes mellitus: Secondary | ICD-10-CM

## 2018-04-19 LAB — CBC WITH DIFFERENTIAL/PLATELET
BASOS ABS: 0 10*3/uL (ref 0.0–0.1)
Basophils Relative: 0.8 % (ref 0.0–3.0)
Eosinophils Absolute: 0.1 10*3/uL (ref 0.0–0.7)
Eosinophils Relative: 1.8 % (ref 0.0–5.0)
HCT: 48.3 % (ref 39.0–52.0)
HEMOGLOBIN: 17.1 g/dL — AB (ref 13.0–17.0)
LYMPHS ABS: 1.4 10*3/uL (ref 0.7–4.0)
Lymphocytes Relative: 35.3 % (ref 12.0–46.0)
MCHC: 35.3 g/dL (ref 30.0–36.0)
MCV: 89.6 fl (ref 78.0–100.0)
MONO ABS: 0.3 10*3/uL (ref 0.1–1.0)
Monocytes Relative: 6.7 % (ref 3.0–12.0)
NEUTROS PCT: 55.4 % (ref 43.0–77.0)
Neutro Abs: 2.3 10*3/uL (ref 1.4–7.7)
Platelets: 152 10*3/uL (ref 150.0–400.0)
RBC: 5.39 Mil/uL (ref 4.22–5.81)
RDW: 12.6 % (ref 11.5–15.5)
WBC: 4.1 10*3/uL (ref 4.0–10.5)

## 2018-04-19 LAB — HEPATIC FUNCTION PANEL
ALBUMIN: 4.7 g/dL (ref 3.5–5.2)
ALT: 48 U/L (ref 0–53)
AST: 30 U/L (ref 0–37)
Alkaline Phosphatase: 57 U/L (ref 39–117)
BILIRUBIN DIRECT: 0.3 mg/dL (ref 0.0–0.3)
BILIRUBIN TOTAL: 2.1 mg/dL — AB (ref 0.2–1.2)
TOTAL PROTEIN: 7.3 g/dL (ref 6.0–8.3)

## 2018-04-19 LAB — BASIC METABOLIC PANEL
BUN: 13 mg/dL (ref 6–23)
CALCIUM: 9.8 mg/dL (ref 8.4–10.5)
CO2: 30 mEq/L (ref 19–32)
Chloride: 102 mEq/L (ref 96–112)
Creatinine, Ser: 0.99 mg/dL (ref 0.40–1.50)
GFR: 78.77 mL/min (ref >60.00)
Glucose, Bld: 112 mg/dL — ABNORMAL HIGH (ref 70–99)
Potassium: 4.3 mEq/L (ref 3.5–5.1)
SODIUM: 139 meq/L (ref 135–145)

## 2018-04-19 LAB — LIPID PANEL
CHOLESTEROL: 176 mg/dL (ref 0–200)
HDL: 43.2 mg/dL (ref >39.00)
LDL Cholesterol: 100 mg/dL — ABNORMAL HIGH (ref 0–99)
NonHDL: 132.56
TRIGLYCERIDES: 161 mg/dL — AB (ref 0.0–149.0)
Total CHOL/HDL Ratio: 4
VLDL: 32.2 mg/dL (ref 0.0–40.0)

## 2018-04-19 LAB — HEMOGLOBIN A1C: Hgb A1c MFr Bld: 4.7 % (ref 4.6–6.5)

## 2018-04-19 NOTE — Addendum Note (Signed)
Addended by: Carter Kitten on: 04/19/2018 09:32 AM   Modules accepted: Orders

## 2018-04-20 LAB — PSA, TOTAL AND FREE
PSA, % Free: 31 % (calc) (ref >25)
PSA, Free: 0.4 ng/mL
PSA, TOTAL: 1.3 ng/mL (ref <=4.0)

## 2018-04-21 ENCOUNTER — Encounter: Payer: Self-pay | Admitting: Family Medicine

## 2018-05-16 ENCOUNTER — Encounter: Payer: Self-pay | Admitting: Family Medicine

## 2018-05-18 ENCOUNTER — Other Ambulatory Visit: Payer: Self-pay | Admitting: Family Medicine

## 2018-05-18 NOTE — Telephone Encounter (Signed)
Can you help him set up his son Broadus John - I want 40 minutes for a new appointment in this case

## 2018-05-19 NOTE — Telephone Encounter (Signed)
Left message asking pt to call office I put phone note in on correct pt

## 2018-05-30 NOTE — Progress Notes (Signed)
Dr. Frederico Hamman T. Gedeon Brandow, MD, [facility] Primary [facility] and Sports Medicine [place], [address] Phone: [phone] Fax: [phone]  05/31/2018  Patient: Timothy Rollins, MRN: [id number], DOB: 27-Oct-1965, 53 y.o.  Primary Physician:  Owens Loffler, MD   Chief Complaint  Patient presents with  . Shoulder Pain    Left   Subjective:   This 53 y.o. male patient noted above presents with shoulder pain that has been ongoing for 3 mo. there is no history of trauma or accident recently The patient denies neck pain or radicular symptoms. Denies dislocation, subluxation, separation of the shoulder. The patient does complain of pain in the overhead plane with significant painful arc of motion.  Timothy Rollins has some L sided shoulder pain that began approx 01/2018 after moving a tree at home that has persisted despite HEP and altered activities.  Pulling in a tree, a couple of days later got really sore about 3 months, not getting better. Cut a tree this weekend. IROM hurts.  When sleeping on the side it will hurt a lot.  Abd will hurt.  Pain at night.  ? Numbness in the little finger only for a split second.  Medications Tried: Tylenol, NSAIDS Ice or Heat: minimally helpful Tried PT: No, HEP only  Prior shoulder Injury: No Prior surgery: No Prior fracture: No  The PMH, PSH, Social History, Family History, Medications, and allergies have been reviewed in [facility], and have been updated if relevant.  Patient Active Problem List   Diagnosis Date Noted  . Hx of colonic polyp 02/15/2016  . Gilbert's syndrome   . HYPERLIPIDEMIA 08/30/2009    Past Medical History:  Diagnosis Date  . Gilbert's syndrome   . Hx of colonic polyp 02/15/2016  . Hyperlipidemia     Past Surgical History:  Procedure Laterality Date  . VASECTOMY  2000  . [place] EXTRACTION  1980    Social History   Socioeconomic History  . Marital status: Married    Spouse name: Not on  file  . Number of children: 3  . Years of education: Not on file  . Highest education level: Not on file  Occupational History  . Occupation: Chief Financial Officer  Social Needs  . Financial resource strain: Not on file  . Food insecurity:    Worry: Not on file    Inability: Not on file  . Transportation needs:    Medical: Not on file    Non-medical: Not on file  Tobacco Use  . Smoking status: Never Smoker  . Smokeless tobacco: Never Used  Substance and Sexual Activity  . Alcohol use: Yes    Alcohol/week: 21.0 standard drinks    Types: 21 Cans of beer per week  . Drug use: No  . Sexual activity: Not on file  Lifestyle  . Physical activity:    Days per week: Not on file    Minutes per session: Not on file  . Stress: Not on file  Relationships  . Social connections:    Talks on phone: Not on file    Gets together: Not on file    Attends religious service: Not on file    Active member of club or organization: Not on file    Attends meetings of clubs or organizations: Not on file    Relationship status: Not on file  . Intimate partner violence:    Fear of current or ex partner: Not on file    Emotionally abused: Not on file  Physically abused: Not on file    Forced sexual activity: Not on file  Other Topics Concern  . Not on file  Social History Narrative   Regular exercise-no          Family History  Problem Relation Age of Onset  . Colon cancer Neg Hx   . Esophageal cancer Neg Hx   . Rectal cancer Neg Hx   . Stomach cancer Neg Hx     No Known Allergies  Medication list reviewed and updated in full in [facility].  GEN: No fevers, chills. Nontoxic. Primarily MSK c/o today. MSK: Detailed in the HPI GI: tolerating PO intake without difficulty Neuro: No numbness, parasthesias, or tingling associated. Otherwise the pertinent positives of the ROS are noted above.   Objective:   Blood pressure 110/86, pulse (!) 58, temperature 98.4 F (36.9 C), temperature  source Oral, height 5' 11.25" (1.81 m), weight 224 lb 8 oz (101.8 kg).  GEN: Well-developed,well-nourished,in no acute distress; alert,appropriate and cooperative throughout examination HEENT: Normocephalic and atraumatic without obvious abnormalities. Ears, externally no deformities PULM: Breathing comfortably in no respiratory distress EXT: No clubbing, cyanosis, or edema PSYCH: Normally interactive. Cooperative during the interview. Pleasant. Friendly and conversant. Not anxious or depressed appearing. Normal, full affect.  Shoulder: L Inspection: No muscle wasting or winging Ecchymosis/edema: neg  AC joint, scapula, clavicle: NT Cervical spine: NT, full ROM Spurling's: neg Abduction: full, 5/5 Flexion: full, 5/5 IR, full, lift-off: 5/5 ER at neutral: full, 5/5 AC crossover: neg Neer: pos Hawkins: pos Drop Test: neg Empty Can: pos Supraspinatus insertion: mild-mod T Bicipital groove: NT Speed's: mild pos Yergason's: neg Sulcus sign: neg Scapular dyskinesis: none C5-T1 intact  Neuro: Sensation intact Grip 5/5   Radiology: No results found.  Assessment and Plan:    Rotator cuff tendonitis, left - Plan: Ambulatory referral to Physical Therapy, methylPREDNISolone acetate (DEPO-MEDROL) injection 80 mg  Left shoulder pain, unspecified chronicity - Plan: DG Shoulder Left, Ambulatory referral to Physical Therapy  Subacromial bursitis of left shoulder joint - Plan: Ambulatory referral to Physical Therapy  Rotator cuff strengthening and scapular stabilization exercises were reviewed with the patient.  Previously doing HEP. Retraining shoulder mechanics and function was emphasized to the patient with rehab done at least 5-6 days a week.  The patient could benefit from formal PT to assist with scapular stabilization and RTC strengthening.  Aspiration/Injection Procedure Note Timothy Rollins [birth year] Date of procedure: 05/31/2018  Procedure: Large Joint Aspiration /  Injection of Shoulder, Subacromial, LEFT Indications: Pain  Procedure Details Verbal consent was obtained from the patient. Risks (including rare infection), benefits, and alternatives were explained. Patient prepped with Chloraprep and Ethyl Chloride used for anesthesia. The subacromial space was injected using the posterior approach. The patient tolerated the procedure well and had decreased pain post injection. No complications. Injection: 8 cc of Lidocaine 1% and 2 mL of Depo-Medrol 40 mg. Needle: 22 gauge Medication: 2 mL of Depo-Medrol 40 mg, equaling Depo-Medrol 80 mg total    Follow-up: +/- 6-8 weeks.  Meds ordered this encounter  Medications  . methylPREDNISolone acetate (DEPO-MEDROL) injection 80 mg   Orders Placed This Encounter  Procedures  . DG Shoulder Left  . Ambulatory referral to Physical Therapy    Signed,  Frederico Hamman T. Anab Vivar, MD   Patient's Medications  New Prescriptions   No medications on file  Previous Medications   ATORVASTATIN (LIPITOR) 20 MG TABLET    TAKE 1 TABLET ONCE DAILY.   MULTIPLE VITAMIN (MULTIVITAMIN)  TABLET    Take 1 tablet by mouth daily.  Modified Medications   No medications on file  Discontinued Medications   No medications on file

## 2018-05-31 ENCOUNTER — Encounter: Payer: Self-pay | Admitting: Family Medicine

## 2018-05-31 ENCOUNTER — Ambulatory Visit: Payer: 59 | Admitting: Family Medicine

## 2018-05-31 ENCOUNTER — Ambulatory Visit (INDEPENDENT_AMBULATORY_CARE_PROVIDER_SITE_OTHER)
Admission: RE | Admit: 2018-05-31 | Discharge: 2018-05-31 | Disposition: A | Discharge status: Home / Self Care | Payer: 59 | Source: Ambulatory Visit | Attending: Family Medicine | Admitting: Family Medicine

## 2018-05-31 ENCOUNTER — Other Ambulatory Visit: Payer: Self-pay

## 2018-05-31 VITALS — BP 110/86 | HR 58 | Temp 98.4°F | Ht 71.25 in | Wt 224.5 lb

## 2018-05-31 DIAGNOSIS — M25512 Pain in left shoulder: Secondary | ICD-10-CM | POA: Diagnosis not present

## 2018-05-31 DIAGNOSIS — M7552 Bursitis of left shoulder: Secondary | ICD-10-CM | POA: Diagnosis not present

## 2018-05-31 DIAGNOSIS — M7582 Other shoulder lesions, left shoulder: Secondary | ICD-10-CM | POA: Diagnosis not present

## 2018-05-31 MED ORDER — METHYLPREDNISOLONE ACETATE 40 MG/ML IJ SUSP
80.0000 mg | Freq: Once | INTRAMUSCULAR | Status: AC
  Administered 2018-05-31: 80 mg via INTRA_ARTICULAR

## 2018-05-31 NOTE — Patient Instructions (Signed)
Alleve 2 tabs by mouth two times a day over the counter: Take at least for 2 - 3 weeks. This is equal to a prescripton strength dose (GENERIC CHEAPER EQUIVALENT IS NAPROXEN SODIUM)    

## 2018-07-05 ENCOUNTER — Encounter: Payer: Self-pay | Admitting: Family Medicine

## 2018-07-11 NOTE — Telephone Encounter (Signed)
Pt dropped off form. Placed in RX tower. °

## 2018-07-11 NOTE — Telephone Encounter (Signed)
Form placed in Dr. Lillie Fragmin in box for review.

## 2019-02-19 ENCOUNTER — Other Ambulatory Visit: Payer: Self-pay

## 2019-02-19 DIAGNOSIS — Z20822 Contact with and (suspected) exposure to covid-19: Secondary | ICD-10-CM

## 2019-02-20 LAB — NOVEL CORONAVIRUS, NAA: SARS-CoV-2, NAA: DETECTED — AB

## 2019-02-23 ENCOUNTER — Encounter: Payer: Self-pay | Admitting: *Deleted

## 2019-05-02 ENCOUNTER — Encounter: Payer: 59 | Admitting: Family Medicine

## 2019-05-29 NOTE — Progress Notes (Signed)
Shelly Shoultz T. Chadd Tollison, MD Primary Care and [facility] at [facility] [place], [address] Phone: [phone]  FAX: [phone]  Timothy Rollins - 54 y.o. male  MRN [id number]  Date of Birth: [birth year]  Visit Date: 05/31/2019  PCP: Owens Loffler, MD  Referred by: Owens Loffler, MD  Chief Complaint  Patient presents with  . Annual Exam    This visit occurred during the SARS-CoV-2 public health emergency.  Safety protocols were in place, including screening questions prior to the visit, additional usage of staff PPE, and extensive cleaning of exam room while observing appropriate contact time as indicated for disinfecting solutions.   Patient Care Team: Owens Loffler, MD as PCP - General Subjective:   Timothy Rollins is a 54 y.o. pleasant patient who presents with the following:  Preventative Health Maintenance Visit:  Health Maintenance Summary Reviewed and updated, unless pt declines services.  Tobacco History Reviewed. Alcohol: No concerns, no excessive use - 2/3 a night Exercise Habits: Some activity, rec at least 30 mins 5 times a week.  Two or 3 times a week will try to walk.  STD concerns: no risk or activity to increase risk Drug Use: None  [place]  [place] 1st shot today  Flu shot  Health Maintenance  Topic Date Due  . HIV Screening  Never done  . INFLUENZA VACCINE  10/21/2018  . COLONOSCOPY  02/08/2021  . TETANUS/TDAP  01/18/2023   Immunization History  Administered Date(s) Administered  . Influenza,inj,Quad PF,6+ Mos 01/17/2013, 04/19/2018  . Tdap 01/17/2013   Patient Active Problem List   Diagnosis Date Noted  . Hx of colonic polyp 02/15/2016  . Gilbert's syndrome   . HYPERLIPIDEMIA 08/30/2009    Past Medical History:  Diagnosis Date  . Gilbert's syndrome   . Hx of colonic polyp 02/15/2016  . Hyperlipidemia     Past Surgical History:    Procedure Laterality Date  . VASECTOMY  2000  . WISDOM TOOTH EXTRACTION  1980    Family History  Problem Relation Age of Onset  . Colon cancer Neg Hx   . Esophageal cancer Neg Hx   . Rectal cancer Neg Hx   . Stomach cancer Neg Hx     Past Medical History, Surgical History, Social History, Family History, Problem List, Medications, and Allergies have been reviewed and updated if relevant.  Review of Systems: Pertinent positives are listed above.  Otherwise, a full 14 point review of systems has been done in full and it is negative except where it is noted positive.  Objective:   BP 110/80   Pulse 63   Temp 98.3 F (36.8 C) (Temporal)   Ht 5\' 11"  (1.803 m)   Wt 223 lb 8 oz (101.4 kg)   SpO2 96%   BMI 31.17 kg/m  Ideal Body Weight: Weight in (lb) to have BMI = 25: 178.9  Ideal Body Weight: Weight in (lb) to have BMI = 25: 178.9 No exam data present Depression screen [facility] 2/9 05/31/2019 04/19/2018 04/13/2017  Decreased Interest 0 0 0  Down, Depressed, Hopeless 0 0 0  PHQ - 2 Score 0 0 0     GEN: well developed, well nourished, no acute distress Eyes: conjunctiva and lids normal, PERRLA, EOMI ENT: TM clear, nares clear, oral exam WNL Neck: supple, no lymphadenopathy, no thyromegaly, no JVD Pulm: clear to auscultation and percussion, respiratory effort normal CV: regular rate  and rhythm, S1-S2, no murmur, rub or gallop, no bruits, peripheral pulses normal and symmetric, no cyanosis, clubbing, edema or varicosities GI: soft, non-tender; no hepatosplenomegaly, masses; active bowel sounds all quadrants GU: no hernia, testicular mass, penile discharge Lymph: no cervical, axillary or inguinal adenopathy MSK: gait normal, muscle tone and strength WNL, no joint swelling, effusions, discoloration, crepitus  SKIN: clear, good turgor, color WNL, no rashes, lesions, or ulcerations Neuro: normal mental status, normal strength, sensation, and motion Psych: alert; oriented to person,  place and time, normally interactive and not anxious or depressed in appearance.  All labs reviewed with patient. Results for orders placed or performed in visit on 02/19/19  Novel Coronavirus, NAA ([facility])   Specimen: Nasopharyngeal(NP) swabs in vial transport medium   NASOPHARYNGE  TESTING  Result Value Ref Range   SARS-CoV-2, NAA Detected (A) Not Detected    Assessment and Plan:     ICD-10-CM   1. Healthcare maintenance  Z00.00    Doing well globally Family doing well  Primary goal is to lose weight and be active  Health Maintenance Exam: The patient's preventative maintenance and recommended screening tests for an annual wellness exam were reviewed in full today. Brought up to date unless services declined.  Counselled on the importance of diet, exercise, and its role in overall health and mortality. The patient's FH and SH was reviewed, including their home life, tobacco status, and drug and alcohol status.  Follow-up in 1 year for physical exam or additional follow-up below.  Follow-up: No follow-ups on file. Or follow-up in 1 year if not noted.  No orders of the defined types were placed in this encounter.  There are no discontinued medications. No orders of the defined types were placed in this encounter.   Signed,  Maud Deed. Krystal Delduca, MD   Allergies as of 05/31/2019   No Known Allergies     Medication List       Accurate as of May 31, 2019  8:35 AM. If you have any questions, ask your nurse or doctor.        atorvastatin 20 MG tablet Commonly known as: LIPITOR TAKE 1 TABLET ONCE DAILY.   multivitamin tablet Take 1 tablet by mouth daily.

## 2019-05-31 ENCOUNTER — Ambulatory Visit (INDEPENDENT_AMBULATORY_CARE_PROVIDER_SITE_OTHER): Payer: 59 | Admitting: Family Medicine

## 2019-05-31 ENCOUNTER — Encounter: Payer: Self-pay | Admitting: Family Medicine

## 2019-05-31 ENCOUNTER — Other Ambulatory Visit: Payer: Self-pay

## 2019-05-31 VITALS — BP 110/80 | HR 63 | Temp 98.3°F | Ht 71.0 in | Wt 223.5 lb

## 2019-05-31 DIAGNOSIS — Z0000 Encounter for general adult medical examination without abnormal findings: Secondary | ICD-10-CM | POA: Diagnosis not present

## 2019-05-31 DIAGNOSIS — Z125 Encounter for screening for malignant neoplasm of prostate: Secondary | ICD-10-CM

## 2019-05-31 DIAGNOSIS — Z131 Encounter for screening for diabetes mellitus: Secondary | ICD-10-CM

## 2019-05-31 LAB — LIPID PANEL
Cholesterol: 160 mg/dL (ref 0–200)
HDL: 47 mg/dL (ref >39.00)
LDL Cholesterol: 88 mg/dL (ref 0–99)
NonHDL: 112.58
Total CHOL/HDL Ratio: 3
Triglycerides: 122 mg/dL (ref 0.0–149.0)
VLDL: 24.4 mg/dL (ref 0.0–40.0)

## 2019-05-31 LAB — CBC WITH DIFFERENTIAL/PLATELET
Basophils Absolute: 0 10*3/uL (ref 0.0–0.1)
Basophils Relative: 0.7 % (ref 0.0–3.0)
Eosinophils Absolute: 0 10*3/uL (ref 0.0–0.7)
Eosinophils Relative: 1.1 % (ref 0.0–5.0)
HCT: 45.5 % (ref 39.0–52.0)
Hemoglobin: 16 g/dL (ref 13.0–17.0)
Lymphocytes Relative: 38.3 % (ref 12.0–46.0)
Lymphs Abs: 1.4 10*3/uL (ref 0.7–4.0)
MCHC: 35.2 g/dL (ref 30.0–36.0)
MCV: 90.7 fl (ref 78.0–100.0)
Monocytes Absolute: 0.3 10*3/uL (ref 0.1–1.0)
Monocytes Relative: 6.9 % (ref 3.0–12.0)
Neutro Abs: 2 10*3/uL (ref 1.4–7.7)
Neutrophils Relative %: 53 % (ref 43.0–77.0)
Platelets: 129 10*3/uL — ABNORMAL LOW (ref 150.0–400.0)
RBC: 5.02 Mil/uL (ref 4.22–5.81)
RDW: 13.4 % (ref 11.5–15.5)
WBC: 3.8 10*3/uL — ABNORMAL LOW (ref 4.0–10.5)

## 2019-05-31 LAB — BASIC METABOLIC PANEL
BUN: 12 mg/dL (ref 6–23)
CO2: 31 mEq/L (ref 19–32)
Calcium: 9.4 mg/dL (ref 8.4–10.5)
Chloride: 103 mEq/L (ref 96–112)
Creatinine, Ser: 1.01 mg/dL (ref 0.40–1.50)
GFR: 76.65 mL/min (ref >60.00)
Glucose, Bld: 103 mg/dL — ABNORMAL HIGH (ref 70–99)
Potassium: 4.2 mEq/L (ref 3.5–5.1)
Sodium: 139 mEq/L (ref 135–145)

## 2019-05-31 LAB — HEPATIC FUNCTION PANEL
ALT: 47 U/L (ref 0–53)
AST: 30 U/L (ref 0–37)
Albumin: 4.6 g/dL (ref 3.5–5.2)
Alkaline Phosphatase: 54 U/L (ref 39–117)
Bilirubin, Direct: 0.4 mg/dL — ABNORMAL HIGH (ref 0.0–0.3)
Total Bilirubin: 2.7 mg/dL — ABNORMAL HIGH (ref 0.2–1.2)
Total Protein: 6.9 g/dL (ref 6.0–8.3)

## 2019-05-31 LAB — HEMOGLOBIN A1C: Hgb A1c MFr Bld: 4.7 % (ref 4.6–6.5)

## 2019-06-01 LAB — PSA, TOTAL WITH REFLEX TO PSA, FREE: PSA, Total: 0.5 ng/mL (ref <=4.0)

## 2019-09-14 ENCOUNTER — Other Ambulatory Visit: Payer: Self-pay | Admitting: Family Medicine

## 2019-12-29 ENCOUNTER — Telehealth: Payer: Self-pay | Admitting: Family Medicine

## 2020-02-25 ENCOUNTER — Ambulatory Visit: Payer: Self-pay | Attending: Family

## 2020-02-25 DIAGNOSIS — Z23 Encounter for immunization: Secondary | ICD-10-CM

## 2020-03-07 ENCOUNTER — Encounter: Payer: Self-pay | Admitting: Family Medicine

## 2020-04-09 ENCOUNTER — Other Ambulatory Visit (INDEPENDENT_AMBULATORY_CARE_PROVIDER_SITE_OTHER): Payer: Self-pay

## 2020-04-09 ENCOUNTER — Telehealth (INDEPENDENT_AMBULATORY_CARE_PROVIDER_SITE_OTHER): Payer: 59 | Admitting: Family Medicine

## 2020-04-09 ENCOUNTER — Other Ambulatory Visit: Payer: Self-pay | Admitting: Family Medicine

## 2020-04-09 ENCOUNTER — Encounter: Payer: Self-pay | Admitting: Family Medicine

## 2020-04-09 VITALS — Temp 97.9°F | Ht 71.0 in

## 2020-04-09 DIAGNOSIS — J988 Other specified respiratory disorders: Secondary | ICD-10-CM

## 2020-04-09 DIAGNOSIS — Z20822 Contact with and (suspected) exposure to covid-19: Secondary | ICD-10-CM

## 2020-04-09 DIAGNOSIS — R059 Cough, unspecified: Secondary | ICD-10-CM

## 2020-04-09 MED ORDER — AZITHROMYCIN 250 MG PO TABS: ORAL_TABLET | ORAL | 0 refills | Status: AC

## 2020-04-09 NOTE — Progress Notes (Signed)
covid

## 2020-04-09 NOTE — Progress Notes (Unsigned)
Timothy Valentine T. Mackinzie Vuncannon, MD Primary Care and [place] at [facility], [address] Phone: [phone]  FAX: [phone]  Timothy Rollins - 55 y.o. male  MRN [id number]  Date of Birth: 1965/12/29  Visit Date: 04/09/2020  PCP: Owens Loffler, MD  Referred by: Owens Loffler, MD  Virtual Visit via Video Note:  I connected with  Timothy Rollins on 04/09/2020 12:20 PM EST by a video enabled telemedicine application and verified that I am speaking with the correct person using two identifiers.   Location patient: home computer, tablet, or smartphone Location provider: work or home office Consent: Verbal consent directly obtained from [employer]. Persons participating in the virtual visit: patient, provider  I discussed the limitations of evaluation and management by telemedicine and the availability of in person appointments. The patient expressed understanding and agreed to proceed.  Chief Complaint  Patient presents with  . Nasal Congestion  . Cough    Deep-Worse at night    History of Present Illness:  A couple of weeks.  02/2019 did have COVID.  02/2020 - he has had his booster shot.  Per his report, he has been having some symptoms since late December.  He has had some rhinorrhea as well as some cough.  He denies any fever.  No clear sick exposures.  Otherwise no symptoms including no GI, neuro, or other systems affected.  Unclear if COVID-19 exposures.  He does work at home.  Immunization History  Administered Date(s) Administered  . Influenza,inj,Quad PF,6+ Mos 01/17/2013, 04/19/2018  . PFIZER(Purple Top)SARS-COV-2 Vaccination 05/31/2019, 06/28/2019  . Tdap 01/17/2013     Review of Systems as above: See pertinent positives and pertinent negatives per HPI No acute distress verbally   Observations/Objective/Exam:  An attempt was made to discern vital signs over the phone and per patient if  applicable and possible.   General:    Alert, Oriented, appears well and in no acute distress  Pulmonary:     On inspection no signs of respiratory distress.  Psych / Neurological:     Pleasant and cooperative.  Assessment and Plan:    ICD-10-CM   1. Suspected COVID-19 virus infection  Z20.822   2. Cough  R05.9   3. Congestion of respiratory tract  J98.8    I still think that you need to assume that this is COVID-19 until proven otherwise.  Aside from this, etiology would be entirely unclear.  He does not have a history of any pulmonary disease, and he certainly could have potentially pertussis or atypical pneumonia that would be causing his more persistent symptoms.  Otherwise etiology is broad.  He had some concerns about potential neoplasm throughout much of his body systems, and I think that we can discuss routine regular health maintenance at his next physical.  I discussed the assessment and treatment plan with the patient. The patient was provided an opportunity to ask questions and all were answered. The patient agreed with the plan and demonstrated an understanding of the instructions.   The patient was advised to call back or seek an in-person evaluation if the symptoms worsen or if the condition fails to improve as anticipated.  Follow-up: prn unless noted otherwise below No follow-ups on file.  Meds ordered this encounter  Medications  . azithromycin (ZITHROMAX) 250 MG tablet    Sig: Take 2 tablets (500 mg total) by mouth daily for 1 day, THEN 1 tablet (250 mg total) daily  for 4 days.    Dispense:  6 tablet    Refill:  0   No orders of the defined types were placed in this encounter.   Signed,  Maud Deed. Arminda Foglio, MD

## 2020-04-10 ENCOUNTER — Encounter: Payer: Self-pay | Admitting: Family Medicine

## 2020-04-11 ENCOUNTER — Telehealth: Payer: Self-pay | Admitting: Family Medicine

## 2020-04-11 LAB — SARS-COV-2, NAA 2 DAY TAT

## 2020-04-11 LAB — SPECIMEN STATUS REPORT

## 2020-04-11 LAB — NOVEL CORONAVIRUS, NAA: SARS-CoV-2, NAA: NOT DETECTED

## 2020-04-11 NOTE — Addendum Note (Signed)
Addended by: Owens Loffler on: 04/11/2020 04:12 PM   Modules accepted: Orders

## 2020-04-11 NOTE — Telephone Encounter (Signed)
I called him a couple of times. Cont Zpak. Reasonable to place a check XR. Open order for GI placed.

## 2020-04-11 NOTE — Telephone Encounter (Signed)
I have not called patient.  Will forward to Dr. Lorelei Pont for next step.

## 2020-04-11 NOTE — Telephone Encounter (Signed)
Patient called in stating that he received a call from [place] in regards to his results. Patient is wanting a return call to discuss next steps. EM

## 2020-04-14 ENCOUNTER — Ambulatory Visit
Admission: RE | Admit: 2020-04-14 | Discharge: 2020-04-14 | Disposition: A | Discharge status: Home / Self Care | Payer: 59 | Source: Ambulatory Visit | Attending: Family Medicine | Admitting: Family Medicine

## 2020-04-14 DIAGNOSIS — R059 Cough, unspecified: Secondary | ICD-10-CM

## 2020-04-14 DIAGNOSIS — J988 Other specified respiratory disorders: Secondary | ICD-10-CM

## 2020-04-15 ENCOUNTER — Other Ambulatory Visit: Payer: Self-pay | Admitting: Family Medicine

## 2020-04-15 ENCOUNTER — Telehealth: Payer: Self-pay | Admitting: *Deleted

## 2020-04-15 DIAGNOSIS — R918 Other nonspecific abnormal finding of lung field: Secondary | ICD-10-CM

## 2020-04-15 DIAGNOSIS — R053 Chronic cough: Secondary | ICD-10-CM

## 2020-04-15 DIAGNOSIS — R9389 Abnormal findings on diagnostic imaging of other specified body structures: Secondary | ICD-10-CM

## 2020-04-15 NOTE — Progress Notes (Signed)
I received a call from Radiology regarding Timothy Rollins's Chest x-ray from yesterday.  He has a nodular opacity of the right lower lung.  Additional follow-up is needed to evaluate for malignancy.  We will obtain a CT of the lung with contrast to evaluate for lung neoplasm.    DG Chest 2 View  Result Date: 04/14/2020 CLINICAL DATA:  Cough EXAM: CHEST - 2 VIEW COMPARISON:  None. FINDINGS: There is a nodular opacity projecting over the lower right lung. Otherwise, lungs are normal. Normal cardiomediastinal contours and pleural spaces. IMPRESSION: Nodular opacity projecting over the lower right lung. Chest CT is recommended for definitive characterization. These results will be called to the ordering clinician or representative by the Radiologist Assistant, and communication documented in the PACS or [employer]. Electronically Signed   By: Ulyses Jarred M.D.   On: 04/14/2020 21:25       ICD-10-CM   1. Right lower lobe lung mass  R91.8 CT Chest W Contrast  2. Abnormal chest x-ray  R93.89 CT Chest W Contrast  3. Persistent cough for 3 weeks or longer  R05.3 CT Chest W Contrast   Orders Placed This Encounter  Procedures  . CT Chest W Contrast   Electronically Signed  By: Owens Loffler, MD On: 04/15/2020 11:37 AM

## 2020-04-15 NOTE — Telephone Encounter (Signed)
Alvie Heidelberg called to let Dr. Lorelei Pont know that patient's x-ray report is in Epic for his review.

## 2020-04-15 NOTE — Telephone Encounter (Signed)
We just discussed this on the phone, and I am going to get him set up for a Chest CT with contrast

## 2020-04-18 ENCOUNTER — Other Ambulatory Visit: Payer: Self-pay

## 2020-04-18 ENCOUNTER — Ambulatory Visit (INDEPENDENT_AMBULATORY_CARE_PROVIDER_SITE_OTHER)
Admission: RE | Admit: 2020-04-18 | Discharge: 2020-04-18 | Disposition: A | Discharge status: Home / Self Care | Payer: 59 | Source: Ambulatory Visit | Attending: Family Medicine | Admitting: Family Medicine

## 2020-04-18 DIAGNOSIS — R918 Other nonspecific abnormal finding of lung field: Secondary | ICD-10-CM | POA: Diagnosis not present

## 2020-04-18 DIAGNOSIS — R053 Chronic cough: Secondary | ICD-10-CM

## 2020-04-18 DIAGNOSIS — R9389 Abnormal findings on diagnostic imaging of other specified body structures: Secondary | ICD-10-CM | POA: Diagnosis not present

## 2020-04-18 MED ORDER — IOHEXOL 300 MG/ML  SOLN
75.0000 mL | Freq: Once | INTRAMUSCULAR | Status: AC | PRN
  Administered 2020-04-18: 75 mL via INTRAVENOUS

## 2020-04-22 MED ORDER — DOXYCYCLINE HYCLATE 100 MG PO TABS: 100.0000 mg | ORAL_TABLET | Freq: Two times a day (BID) | ORAL | 0 refills | Status: AC

## 2020-06-12 ENCOUNTER — Encounter: Payer: 59 | Admitting: Family Medicine

## 2020-06-24 NOTE — Progress Notes (Signed)
   Covid-19 Vaccination Clinic  Name:  Timothy Rollins    MRN: [id number] DOB: 1965/04/11  06/24/2020  [name] was observed post Covid-19 immunization for 15 minutes without incident. He was provided with Vaccine Information Sheet and instruction to access the V-Safe system.   [name] was instructed to call 911 with any severe reactions post vaccine: Marland Kitchen Difficulty breathing  . Swelling of face and throat  . A fast heartbeat  . A bad rash all over body  . Dizziness and weakness   Immunizations Administered    Name Date Dose VIS Date Route   Pfizer COVID-19 Vaccine 02/25/2020  1:00 PM 0.3 mL 01/09/2020 Intramuscular   Manufacturer: [place]   Lot: [id number]   NDC: [address]-1

## 2020-09-01 ENCOUNTER — Other Ambulatory Visit: Payer: Self-pay | Admitting: Family Medicine

## 2020-09-01 DIAGNOSIS — Z79899 Other long term (current) drug therapy: Secondary | ICD-10-CM

## 2020-09-01 DIAGNOSIS — E785 Hyperlipidemia, unspecified: Secondary | ICD-10-CM

## 2020-09-01 DIAGNOSIS — Z125 Encounter for screening for malignant neoplasm of prostate: Secondary | ICD-10-CM

## 2020-09-01 DIAGNOSIS — Z131 Encounter for screening for diabetes mellitus: Secondary | ICD-10-CM

## 2020-09-03 ENCOUNTER — Other Ambulatory Visit: Payer: 59

## 2020-09-09 ENCOUNTER — Other Ambulatory Visit: Payer: Self-pay | Admitting: Family Medicine

## 2020-09-09 NOTE — Progress Notes (Signed)
Timothy Mcdade T. Sriram Febles, MD, [place] at [facility] [place], [address]  Phone: [phone]  FAX: [phone]  Timothy Rollins - 55 y.o. male  MRN [id number]  Date of Birth: December 14, 1965  Date: 09/10/2020  PCP: Owens Loffler, MD  Referral: Owens Loffler, MD  Chief Complaint  Patient presents with   Annual Exam    This visit occurred during the SARS-CoV-2 public health emergency.  Safety protocols were in place, including screening questions prior to the visit, additional usage of staff PPE, and extensive cleaning of exam room while observing appropriate contact time as indicated for disinfecting solutions.   Patient Care Team: Owens Loffler, MD as PCP - General Subjective:   Timothy Rollins is a 55 y.o. pleasant patient who presents with the following:  Preventative Health Maintenance Visit:  Health Maintenance Summary Reviewed and updated, unless pt declines services.  Tobacco History Reviewed. Shingles vaccine Hep C or HIV? Repeat colonoscopy, GI will contact  Sex drive is down some.  The past year has gone down some.  He brings up some questions about libido in general as well as testosterone and usage of Viagra and other similar medications.   Health Maintenance  Topic Date Due   Pneumococcal Vaccine 21-97 Years old (1 - PCV) Never done   Hepatitis C Screening  Never done   Zoster Vaccines- Shingrix (1 of 2) Never done   COVID-19 Vaccine (4 - Booster for Pfizer series) 05/25/2020   INFLUENZA VACCINE  10/20/2020   COLONOSCOPY (Pts 45-60yrs Insurance coverage will need to be confirmed)  02/08/2021   TETANUS/TDAP  01/18/2023   HIV Screening  Completed   HPV VACCINES  Aged Out   Immunization History  Administered Date(s) Administered   Influenza,inj,Quad PF,6+ Mos 01/17/2013, 04/19/2018   PFIZER(Purple Top)SARS-COV-2 Vaccination 05/31/2019, 06/28/2019, 02/25/2020   Tdap 01/17/2013   Patient  Active Problem List   Diagnosis Date Noted   Hx of colonic polyp 02/15/2016   Gilbert's syndrome    HYPERLIPIDEMIA 08/30/2009    Past Medical History:  Diagnosis Date   Gilbert's syndrome    Hx of colonic polyp 02/15/2016   Hyperlipidemia     Past Surgical History:  Procedure Laterality Date   VASECTOMY  2000   WISDOM TOOTH EXTRACTION  1980    Family History  Problem Relation Age of Onset   Colon cancer Neg Hx    Esophageal cancer Neg Hx    Rectal cancer Neg Hx    Stomach cancer Neg Hx     Past Medical History, Surgical History, Social History, Family History, Problem List, Medications, and Allergies have been reviewed and updated if relevant.  Review of Systems: Pertinent positives are listed above.  Otherwise, a full 14 point review of systems has been done in full and it is negative except where it is noted positive.  Objective:   BP 110/88   Pulse (!) 58   Temp 98.2 F (36.8 C) (Temporal)   Ht 5\' 11"  (1.803 m)   Wt 224 lb 8 oz (101.8 kg)   SpO2 97%   BMI 31.31 kg/m  Ideal Body Weight: Weight in (lb) to have BMI = 25: 178.9  Ideal Body Weight: Weight in (lb) to have BMI = 25: 178.9 No results found. Depression screen [facility] 2/9 09/10/2020 05/31/2019 04/19/2018 04/13/2017  Decreased Interest 0 0 0 0  Down, Depressed, Hopeless 0 0 0 0  PHQ - 2 Score 0 0 0 0  GEN: well developed, well nourished, no acute distress Eyes: conjunctiva and lids normal, PERRLA, EOMI ENT: TM clear, nares clear, oral exam WNL Neck: supple, no lymphadenopathy, no thyromegaly, no JVD Pulm: clear to auscultation and percussion, respiratory effort normal CV: regular rate and rhythm, S1-S2, no murmur, rub or gallop, no bruits, peripheral pulses normal and symmetric, no cyanosis, clubbing, edema or varicosities GI: soft, non-tender; no hepatosplenomegaly, masses; active bowel sounds all quadrants GU: deferred Lymph: no cervical, axillary or inguinal adenopathy MSK: gait normal, muscle  tone and strength WNL, no joint swelling, effusions, discoloration, crepitus  SKIN: clear, good turgor, color WNL, no rashes, lesions, or ulcerations Neuro: normal mental status, normal strength, sensation, and motion Psych: alert; oriented to person, place and time, normally interactive and not anxious or depressed in appearance.  All labs reviewed with patient. None done yet  Assessment and Plan:     ICD-10-CM   1. Healthcare maintenance  Z00.00     2. Screening for HIV (human immunodeficiency virus)  Z11.4 HIV Antibody (routine testing w rflx)    3. Need for hepatitis C screening test  Z11.59 Hepatitis C antibody    4. Decreased libido  R68.82 Testos,Total,Free and SHBG (Male)    5. Screening PSA (prostate specific antigen)  Z12.5 PSA, Total with Reflex to PSA, Free    6. Screening for diabetes mellitus (DM)  Z13.1 Hemoglobin A1c    7. Encounter for long-term (current) use of medications  Z79.899 CBC with Differential/Platelet    Basic metabolic panel    Hepatic function panel    8. Hyperlipidemia, unspecified hyperlipidemia type  E78.5 Lipid panel     Ordered all of his labs.  At his request check testosterone AM.  Also add HIV and hep C.  We ended up talking a lot about libido and erectile quality and erectile dysfunction.  I did my best to answer all of his questions.  I also am going to give him some Viagra to try.  Follow-up colonoscopy with GI. Up-to-date with COVID-19 vaccine.  Health Maintenance Exam: The patient's preventative maintenance and recommended screening tests for an annual wellness exam were reviewed in full today. Brought up to date unless services declined.  Counselled on the importance of diet, exercise, and its role in overall health and mortality. The patient's FH and SH was reviewed, including their home life, tobacco status, and drug and alcohol status.  Follow-up in 1 year for physical exam or additional follow-up below.  Follow-up: No  follow-ups on file. Or follow-up in 1 year if not noted.  Meds ordered this encounter  Medications   sildenafil (VIAGRA) 100 MG tablet    Sig: Take 0.5-1 tablets (50-100 mg total) by mouth daily as needed for erectile dysfunction.    Dispense:  30 tablet    Refill:  5    There are no discontinued medications. Orders Placed This Encounter  Procedures   Testos,Total,Free and SHBG (Male)   Hepatitis C antibody   HIV Antibody (routine testing w rflx)     Signed,  Eulalie Speights T. Zia Kanner, MD   Allergies as of 09/10/2020   No Known Allergies      Medication List        Accurate as of September 10, 2020  5:49 PM. If you have any questions, ask your nurse or doctor.          atorvastatin 20 MG tablet Commonly known as: LIPITOR TAKE 1 TABLET BY MOUTH EVERY DAY   multivitamin tablet Take  1 tablet by mouth daily.   sildenafil 100 MG tablet Commonly known as: Viagra Take 0.5-1 tablets (50-100 mg total) by mouth daily as needed for erectile dysfunction. Started by: Owens Loffler, MD

## 2020-09-10 ENCOUNTER — Other Ambulatory Visit: Payer: Self-pay

## 2020-09-10 ENCOUNTER — Ambulatory Visit (INDEPENDENT_AMBULATORY_CARE_PROVIDER_SITE_OTHER): Payer: 59 | Admitting: Family Medicine

## 2020-09-10 ENCOUNTER — Encounter: Payer: Self-pay | Admitting: Family Medicine

## 2020-09-10 VITALS — BP 110/88 | HR 58 | Temp 98.2°F | Ht 71.0 in | Wt 224.5 lb

## 2020-09-10 DIAGNOSIS — Z131 Encounter for screening for diabetes mellitus: Secondary | ICD-10-CM | POA: Diagnosis not present

## 2020-09-10 DIAGNOSIS — Z79899 Other long term (current) drug therapy: Secondary | ICD-10-CM | POA: Diagnosis not present

## 2020-09-10 DIAGNOSIS — Z125 Encounter for screening for malignant neoplasm of prostate: Secondary | ICD-10-CM

## 2020-09-10 DIAGNOSIS — R6882 Decreased libido: Secondary | ICD-10-CM

## 2020-09-10 DIAGNOSIS — Z114 Encounter for screening for human immunodeficiency virus [HIV]: Secondary | ICD-10-CM | POA: Diagnosis not present

## 2020-09-10 DIAGNOSIS — Z0000 Encounter for general adult medical examination without abnormal findings: Secondary | ICD-10-CM

## 2020-09-10 DIAGNOSIS — E785 Hyperlipidemia, unspecified: Secondary | ICD-10-CM

## 2020-09-10 DIAGNOSIS — Z1159 Encounter for screening for other viral diseases: Secondary | ICD-10-CM | POA: Diagnosis not present

## 2020-09-10 LAB — CBC WITH DIFFERENTIAL/PLATELET
Basophils Absolute: 0 10*3/uL (ref 0.0–0.1)
Basophils Relative: 0.7 % (ref 0.0–3.0)
Eosinophils Absolute: 0 10*3/uL (ref 0.0–0.7)
Eosinophils Relative: 1.1 % (ref 0.0–5.0)
HCT: 45.5 % (ref 39.0–52.0)
Hemoglobin: 16.1 g/dL (ref 13.0–17.0)
Lymphocytes Relative: 37 % (ref 12.0–46.0)
Lymphs Abs: 1.3 10*3/uL (ref 0.7–4.0)
MCHC: 35.3 g/dL (ref 30.0–36.0)
MCV: 90.6 fl (ref 78.0–100.0)
Monocytes Absolute: 0.3 10*3/uL (ref 0.1–1.0)
Monocytes Relative: 7.5 % (ref 3.0–12.0)
Neutro Abs: 1.9 10*3/uL (ref 1.4–7.7)
Neutrophils Relative %: 53.7 % (ref 43.0–77.0)
Platelets: 138 10*3/uL — ABNORMAL LOW (ref 150.0–400.0)
RBC: 5.03 Mil/uL (ref 4.22–5.81)
RDW: 13.2 % (ref 11.5–15.5)
WBC: 3.6 10*3/uL — ABNORMAL LOW (ref 4.0–10.5)

## 2020-09-10 LAB — LIPID PANEL
Cholesterol: 155 mg/dL (ref 0–200)
HDL: 46.9 mg/dL (ref >39.00)
LDL Cholesterol: 77 mg/dL (ref 0–99)
NonHDL: 108.05
Total CHOL/HDL Ratio: 3
Triglycerides: 154 mg/dL — ABNORMAL HIGH (ref 0.0–149.0)
VLDL: 30.8 mg/dL (ref 0.0–40.0)

## 2020-09-10 LAB — HEPATIC FUNCTION PANEL
ALT: 36 U/L (ref 0–53)
AST: 27 U/L (ref 0–37)
Albumin: 4.8 g/dL (ref 3.5–5.2)
Alkaline Phosphatase: 54 U/L (ref 39–117)
Bilirubin, Direct: 0.4 mg/dL — ABNORMAL HIGH (ref 0.0–0.3)
Total Bilirubin: 3.1 mg/dL — ABNORMAL HIGH (ref 0.2–1.2)
Total Protein: 7.4 g/dL (ref 6.0–8.3)

## 2020-09-10 LAB — BASIC METABOLIC PANEL
BUN: 16 mg/dL (ref 6–23)
CO2: 30 mEq/L (ref 19–32)
Calcium: 10 mg/dL (ref 8.4–10.5)
Chloride: 103 mEq/L (ref 96–112)
Creatinine, Ser: 0.97 mg/dL (ref 0.40–1.50)
GFR: 87.32 mL/min (ref >60.00)
Glucose, Bld: 98 mg/dL (ref 70–99)
Potassium: 4.3 mEq/L (ref 3.5–5.1)
Sodium: 139 mEq/L (ref 135–145)

## 2020-09-10 LAB — HEMOGLOBIN A1C: Hgb A1c MFr Bld: 4.8 % (ref 4.6–6.5)

## 2020-09-10 MED ORDER — SILDENAFIL CITRATE 100 MG PO TABS: 50.0000 mg | ORAL_TABLET | Freq: Every day | ORAL | 5 refills | Status: DC | PRN

## 2020-09-10 NOTE — Patient Instructions (Signed)
GoodRx - look up viagra or cialis

## 2020-09-15 LAB — HEPATITIS C ANTIBODY
Hepatitis C Ab: NONREACTIVE
SIGNAL TO CUT-OFF: 0.01 (ref <1.00)

## 2020-09-15 LAB — HIV ANTIBODY (ROUTINE TESTING W REFLEX): HIV 1&2 Ab, 4th Generation: NONREACTIVE

## 2020-09-15 LAB — TESTOS,TOTAL,FREE AND SHBG (FEMALE)
Free Testosterone: 50.9 pg/mL (ref 35.0–155.0)
Sex Hormone Binding: 74 nmol/L (ref 22–77)
Testosterone, Total, LC-MS-MS: 606 ng/dL (ref 250–1100)

## 2020-09-15 LAB — PSA, TOTAL WITH REFLEX TO PSA, FREE: PSA, Total: 0.7 ng/mL (ref <=4.0)

## 2020-12-08 ENCOUNTER — Other Ambulatory Visit: Payer: Self-pay | Admitting: Family Medicine

## 2021-03-02 ENCOUNTER — Ambulatory Visit: Payer: 59 | Admitting: Family Medicine

## 2021-03-04 ENCOUNTER — Encounter: Payer: Self-pay | Admitting: Family Medicine

## 2021-03-04 ENCOUNTER — Ambulatory Visit: Payer: 59 | Admitting: Family Medicine

## 2021-03-04 ENCOUNTER — Other Ambulatory Visit: Payer: Self-pay

## 2021-03-04 VITALS — BP 118/80 | HR 58 | Temp 98.0°F | Ht 71.0 in | Wt 210.2 lb

## 2021-03-04 DIAGNOSIS — M545 Low back pain, unspecified: Secondary | ICD-10-CM

## 2021-03-04 MED ORDER — TIZANIDINE HCL 4 MG PO TABS: 4.0000 mg | ORAL_TABLET | Freq: Every evening | ORAL | 1 refills | Status: DC | PRN

## 2021-03-04 NOTE — Progress Notes (Signed)
Timothy Thissen T. Hayk Divis, MD, [place] at [facility] [place], [address]  Phone: [phone]   FAX: [phone]  Timothy Rollins - 55 y.o. male   MRN [id number]   Date of Birth: 10/01/1965  Date: 03/04/2021   PCP: Owens Loffler, MD   Referral: Owens Loffler, MD  Chief Complaint  Patient presents with   Back Pain    X 10 days    This visit occurred during the SARS-CoV-2 public health emergency.  Safety protocols were in place, including screening questions prior to the visit, additional usage of staff PPE, and extensive cleaning of exam room while observing appropriate contact time as indicated for disinfecting solutions.   Subjective:   Timothy Rollins is a 55 y.o. very pleasant male patient who presents with the following: Back Pain  ongoing for approximately: 1 week The patient has had back pain before.  Nothing severe in the past. The back pain is localized into the lumbar spine area. They also describe no radiculopathy.  CT Chest bone reviewed.  Minimal degenerative changes only.   Had been doing great and then was cleaning some wood at his house.  Was cleaning up some brush and then had a catch.   Sat back and was able to finished some things and then Sunday morning, had a lot more pain.  Tues and it has progresssiebly gotten better.   No meds right now.  Has been stretching it out some.  Will feel it when he leans forward.  Globally, he has been is having some improvement.  No numbness or tingling. No bowel or bladder incontinence. No focal weakness. Prior interventions: None Physical therapy: No Chiropractic manipulations: No Acupuncture: No Osteopathic manipulation: No Heat or cold: This will help some   Review of Systems is noted in the HPI, as appropriate  Objective:   Blood pressure 118/80, pulse (!) 58, temperature 98 F (36.7 C), temperature source Temporal, height 5\' 11"  (1.803 m), weight 210  lb 4 oz (95.4 kg), SpO2 97 %.  GEN: No acute distress; alert,appropriate. PULM: Breathing comfortably in no respiratory distress PSYCH: Normally interactive.   Range of motion at  the waist: Flexion, rotation and lateral bending: Full  No echymosis or edema Rises to examination table with no difficulty Gait: minimally antalgic  Inspection/Deformity: No abnormality Paraspinus T: Mild L4-S1 bilateral  B Ankle Dorsiflexion (L5,4): 5/5 B Great Toe Dorsiflexion (L5,4): 5/5 Heel Walk (L5): WNL Toe Walk (S1): WNL Rise/Squat (L4): WNL, mild pain  SENSORY B Medial Foot (L4): WNL B Dorsum (L5): WNL B Lateral (S1): WNL Light Touch: WNL  REFLEXES Knee (L4): 2+ Ankle (S1): 2+  B SLR, seated: neg B SLR, supine: neg B Greater Troch: NT B Log Roll: neg B Sciatic Notch: NT  Radiology: No results found.  Assessment and Plan:     ICD-10-CM   1. Acute midline low back pain without sciatica  M54.50       Anatomy reviewed. Conservative algorithms for acute back pain generally begin with the following: NSAIDS, Muscle Relaxants, Mild pain medication if needed.  I did give him some Zanaflex.  Start with medications, core rehab, and progress from there following low back pain algorithm. No red flags are present.  Follow-up: As needed  Meds ordered this encounter  Medications   tiZANidine (ZANAFLEX) 4 MG tablet    Sig: Take 1 tablet (4 mg total) by mouth at bedtime as needed for muscle spasms.  Dispense:  30 tablet    Refill:  1   Medications Discontinued During This Encounter  Medication Reason   0.9 %  sodium chloride infusion    No orders of the defined types were placed in this encounter.   Signed,  Maud Deed. Lynwood Kubisiak, MD   Outpatient Encounter Medications as of 03/04/2021  Medication Sig   atorvastatin (LIPITOR) 20 MG tablet TAKE 1 TABLET BY MOUTH EVERY DAY   Multiple Vitamin (MULTIVITAMIN) tablet Take 1 tablet by mouth daily.   sildenafil (VIAGRA) 100 MG  tablet Take 0.5-1 tablets (50-100 mg total) by mouth daily as needed for erectile dysfunction.   tiZANidine (ZANAFLEX) 4 MG tablet Take 1 tablet (4 mg total) by mouth at bedtime as needed for muscle spasms.   [DISCONTINUED] 0.9 %  sodium chloride infusion    No facility-administered encounter medications on file as of 03/04/2021.

## 2021-03-27 ENCOUNTER — Other Ambulatory Visit: Payer: Self-pay | Admitting: Family Medicine

## 2021-03-27 NOTE — Telephone Encounter (Signed)
Left message for Mr. Timothy Rollins to return call to let me know if he requested this refill or if the pharmacy sent it as an automatic refill request.

## 2021-04-23 ENCOUNTER — Encounter: Payer: Self-pay | Admitting: Internal Medicine

## 2021-04-30 ENCOUNTER — Encounter: Payer: Self-pay | Admitting: Family Medicine

## 2021-05-18 ENCOUNTER — Encounter: Payer: Self-pay | Admitting: Internal Medicine

## 2021-07-02 ENCOUNTER — Ambulatory Visit (AMBULATORY_SURGERY_CENTER): Payer: No Typology Code available for payment source

## 2021-07-02 VITALS — Ht 71.0 in | Wt 210.0 lb

## 2021-07-02 DIAGNOSIS — Z86010 Personal history of colonic polyps: Secondary | ICD-10-CM

## 2021-07-02 NOTE — Progress Notes (Signed)
No egg or soy allergy known to patient  ?No issues known to pt with past sedation with any surgeries or procedures ?Patient denies ever being told they had issues or difficulty with intubation  ?No FH of Malignant Hyperthermia ?Pt is not on diet pills ?Pt is not on home 02  ?Pt is not on blood thinners  ?Pt denies issues with constipation;  ?No A fib or A flutter ?NO PA's for preps discussed with pt in PV today  ?Discussed with pt there will be an out-of-pocket cost for prep and that varies from $0 to 70 + dollars - pt verbalized understanding  ?Pt instructed to use Singlecare.com or GoodRx for a price reduction on prep  ?PV completed over the phone. Pt verified name, DOB, address and insurance during PV today.  ?Pt mailed instruction packet with copy of consent form to read and not return, and instructions.  ?Pt encouraged to call with questions or issues.  ?If pt has My chart, procedure instructions sent via My Chart  ? ?

## 2021-07-14 ENCOUNTER — Encounter: Payer: Self-pay | Admitting: Internal Medicine

## 2021-07-23 ENCOUNTER — Ambulatory Visit (AMBULATORY_SURGERY_CENTER): Payer: No Typology Code available for payment source | Admitting: Internal Medicine

## 2021-07-23 ENCOUNTER — Encounter: Payer: Self-pay | Admitting: Internal Medicine

## 2021-07-23 VITALS — BP 126/76 | HR 72 | Temp 98.9°F | Resp 15 | Ht 71.0 in | Wt 210.0 lb

## 2021-07-23 DIAGNOSIS — D123 Benign neoplasm of transverse colon: Secondary | ICD-10-CM

## 2021-07-23 DIAGNOSIS — K635 Polyp of colon: Secondary | ICD-10-CM | POA: Diagnosis not present

## 2021-07-23 DIAGNOSIS — Z86010 Personal history of colonic polyps: Secondary | ICD-10-CM | POA: Diagnosis present

## 2021-07-23 MED ORDER — SODIUM CHLORIDE 0.9 % IV SOLN: 500.0000 mL | Freq: Once | INTRAVENOUS | Status: DC

## 2021-07-23 NOTE — Progress Notes (Signed)
[facility] History and Physical ? ? ?Primary Care Physician:  Owens Loffler, MD ? ? ?Reason for Procedure:   Hx colon polyp ? ?Plan:    Colonoscopy ? ? ? ? ?HPI: Timothy Rollins is a 56 y.o. male w/ hx 6 mm sessile serrated polyp removed 01/2016 ? ? ?Past Medical History:  ?Diagnosis Date  ? Gilbert's syndrome   ? Hx of colonic polyp 02/15/2016  ? Hyperlipidemia   ? on meds  ? ? ?Past Surgical History:  ?Procedure Laterality Date  ? COLONOSCOPY  2017  ? CG-MAC-miralax(good)-SSP  ? POLYPECTOMY  2017  ? SSP  ? VASECTOMY  2000  ? [place] EXTRACTION  1980  ? ? ?Prior to Admission medications   ?Medication Sig Start Date End Date Taking? Authorizing Provider  ?atorvastatin (LIPITOR) 20 MG tablet TAKE 1 TABLET BY MOUTH EVERY DAY 12/08/20  Yes Copland, Frederico Hamman, MD  ?Multiple Vitamin (MULTIVITAMIN) tablet Take 1 tablet by mouth daily.   Yes [provider]  ?sildenafil (VIAGRA) 100 MG tablet Take 0.5-1 tablets (50-100 mg total) by mouth daily as needed for erectile dysfunction. 09/10/20   Owens Loffler, MD  ? ? ?Current Outpatient Medications  ?Medication Sig Dispense Refill  ? atorvastatin (LIPITOR) 20 MG tablet TAKE 1 TABLET BY MOUTH EVERY DAY 90 tablet 3  ? Multiple Vitamin (MULTIVITAMIN) tablet Take 1 tablet by mouth daily.    ? sildenafil (VIAGRA) 100 MG tablet Take 0.5-1 tablets (50-100 mg total) by mouth daily as needed for erectile dysfunction. 30 tablet 5  ? ?Current Facility-Administered Medications  ?Medication Dose Route Frequency Provider Last Rate Last Admin  ? 0.9 %  sodium chloride infusion  500 mL Intravenous Once Gatha Mayer, MD      ? ? ?Allergies as of 07/23/2021  ? (No Known Allergies)  ? ? ?Family History  ?Problem Relation Age of Onset  ? Cancer Father   ? Bone cancer Father   ? Lung cancer Father   ? Colon cancer Neg Hx   ? Esophageal cancer Neg Hx   ? Rectal cancer Neg Hx   ? Stomach cancer Neg Hx   ? ? ?Social History  ? ?Socioeconomic History  ? Marital status:  Married  ?  Spouse name: Not on file  ? Number of children: 3  ? Years of education: Not on file  ? Highest education level: Not on file  ?Occupational History  ? Occupation: Chief Financial Officer  ?Tobacco Use  ? Smoking status: Never  ? Smokeless tobacco: Never  ?Vaping Use  ? Vaping Use: Never used  ?Substance and Sexual Activity  ? Alcohol use: Yes  ?  Alcohol/week: 14.0 - 21.0 standard drinks  ?  Types: 14 - 21 Standard drinks or equivalent per week  ? Drug use: No  ? Sexual activity: Not on file  ?Other Topics Concern  ? Not on file  ?Social History Narrative  ? Regular exercise-no  ?   ?   ? ?Social Determinants of Health  ? ?Financial Resource Strain: Not on file  ?Food Insecurity: Not on file  ?Transportation Needs: Not on file  ?Physical Activity: Not on file  ?Stress: Not on file  ?Social Connections: Not on file  ?Intimate Partner Violence: Not on file  ? ? ?Review of Systems: ? ?All other review of systems negative except as mentioned in the HPI. ? ?Physical Exam: ?Vital signs ?BP (!) 142/77   Pulse 61   Temp 98.9 ?F (37.2 ?C)   Ht 5'  11" (1.803 m)   Wt 210 lb (95.3 kg)   SpO2 100%   BMI 29.29 kg/m?  ? ?General:   Alert,  Well-developed, well-nourished, pleasant and cooperative in NAD ?Lungs:  Clear throughout to auscultation.   ?Heart:  Regular rate and rhythm; no murmurs, clicks, rubs,  or gallops. ?Abdomen:  Soft, nontender and nondistended. Normal bowel sounds.   ?Neuro/Psych:  Alert and cooperative. Normal mood and affect. A and O x 3 ? ? ?_0  E. Carlean Purl, MD, [facility] ?[facility] ?[phone] (pager) ?07/23/2021 10:25 AM@ ? ?

## 2021-07-23 NOTE — Op Note (Signed)
[place] ?Patient Name: Timothy Rollins ?Procedure Date: 07/23/2021 10:27 AM ?MRN: [id number] ?Endoscopist: Gatha Mayer , MD ?Age: 56 ?Referring MD:  ?Date of Birth: 06-Nov-1965 ?Gender: Male ?Account #: [id number] ?Procedure:                Colonoscopy ?Indications:              High risk colon cancer surveillance: Personal  ?                          history of sessile serrated colon polyp (less than  ?                          10 mm in size) with no dysplasia, Last colonoscopy:  ?                          2017 ?Medicines:                Monitored Anesthesia Care ?Procedure:                Pre-Anesthesia Assessment: ?                          - Prior to the procedure, a History and Physical  ?                          was performed, and patient medications and  ?                          allergies were reviewed. The patient's tolerance of  ?                          previous anesthesia was also reviewed. The risks  ?                          and benefits of the procedure and the sedation  ?                          options and risks were discussed with the patient.  ?                          All questions were answered, and informed consent  ?                          was obtained. Prior Anticoagulants: The patient has  ?                          taken no previous anticoagulant or antiplatelet  ?                          agents. ASA Grade Assessment: II - A patient with  ?                          mild systemic disease. After reviewing the risks  ?  and benefits, the patient was deemed in  ?                          satisfactory condition to undergo the procedure. ?                          After obtaining informed consent, the colonoscope  ?                          was passed under direct vision. Throughout the  ?                          procedure, the patient's blood pressure, pulse, and  ?                          oxygen saturations were monitored continuously. The  ?                           CF [id number] #[id number] was introduced through the anus  ?                          and advanced to the the cecum, identified by  ?                          appendiceal orifice and ileocecal valve. The  ?                          colonoscopy was performed without difficulty. The  ?                          patient tolerated the procedure well. The quality  ?                          of the bowel preparation was good. The ileocecal  ?                          valve, appendiceal orifice, and rectum were  ?                          photographed. The bowel preparation used was  ?                          Miralax via split dose instruction. ?Scope In: 10:37:25 AM ?Scope Out: 10:56:26 AM ?Scope Withdrawal Time: 0 hours 13 minutes 54 seconds  ?Total Procedure Duration: 0 hours 19 minutes 1 second  ?Findings:                 The perianal and digital rectal examinations were  ?                          normal. Pertinent negatives include normal prostate  ?                          (size, shape, and consistency). ?  A diminutive polyp was found in the transverse  ?                          colon. The polyp was sessile. The polyp was removed  ?                          with a cold snare. Resection and retrieval were  ?                          complete. Verification of patient identification  ?                          for the specimen was done. Estimated blood loss was  ?                          minimal. ?                          The exam was otherwise without abnormality on  ?                          direct and retroflexion views. ?Complications:            No immediate complications. ?Estimated Blood Loss:     Estimated blood loss was minimal. ?Impression:               - One diminutive polyp in the transverse colon,  ?                          removed with a cold snare. Resected and retrieved. ?                          - The examination was otherwise normal on direct  ?                           and retroflexion views. ?                          - Personal history of colonic polyp 6 mm ssp 2017. ?Recommendation:           - Patient has a contact number available for  ?                          emergencies. The signs and symptoms of potential  ?                          delayed complications were discussed with the  ?                          patient. Return to normal activities tomorrow.  ?                          Written discharge instructions were provided to the  ?  patient. ?                          - Resume previous diet. ?                          - Continue present medications. ?                          - Await pathology results. ?                          - Repeat colonoscopy is recommended. The  ?                          colonoscopy date will be determined after pathology  ?                          results from today's exam become available for  ?                          review. ?Gatha Mayer, MD ?07/23/2021 11:04:50 AM ?This report has been signed electronically. ?

## 2021-07-23 NOTE — Progress Notes (Signed)
To pacu, VSS. Report to [email or web address] 

## 2021-07-23 NOTE — Progress Notes (Signed)
Called to room to assist during endoscopic procedure.  Patient ID and intended procedure confirmed with present staff. Received instructions for my participation in the procedure from the performing physician.  

## 2021-07-23 NOTE — Patient Instructions (Addendum)
I found and removed one tiny polyp (again). It looks benign. ? ?I will let you know pathology results and when to have another routine colonoscopy by mail and/or My Chart. ? ?I appreciate the opportunity to care for you. ?Gatha Mayer, MD, [facility] ? ?YOU HAD AN ENDOSCOPIC PROCEDURE TODAY AT [facility]:   Refer to the procedure report that was given to you for any specific questions about what was found during the examination.  If the procedure report does not answer your questions, please call your gastroenterologist to clarify.  If you requested that your care partner not be given the details of your procedure findings, then the procedure report has been included in a sealed envelope for you to review at your convenience later. ? ?YOU SHOULD EXPECT: Some feelings of bloating in the abdomen. Passage of more gas than usual.  Walking can help get rid of the air that was put into your GI tract during the procedure and reduce the bloating. If you had a lower endoscopy (such as a colonoscopy or flexible sigmoidoscopy) you may notice spotting of blood in your stool or on the toilet paper. If you underwent a bowel prep for your procedure, you may not have a normal bowel movement for a few days. ? ?Please Note:  You might notice some irritation and congestion in your nose or some drainage.  This is from the oxygen used during your procedure.  There is no need for concern and it should clear up in a day or so. ? ?SYMPTOMS TO REPORT IMMEDIATELY: ? ?Following lower endoscopy (colonoscopy or flexible sigmoidoscopy): ? Excessive amounts of blood in the stool ? Significant tenderness or worsening of abdominal pains ? Swelling of the abdomen that is new, acute ? Fever of 100?F or higher ? ?For urgent or emergent issues, a gastroenterologist can be reached at any hour by calling [phone]. ?Do not use MyChart messaging for urgent concerns.  ? ? ?DIET:  We do recommend a small meal at first, but then you may  proceed to your regular diet.  Drink plenty of fluids but you should avoid alcoholic beverages for 24 hours. ? ?ACTIVITY:  You should plan to take it easy for the rest of today and you should NOT DRIVE or use heavy machinery until tomorrow (because of the sedation medicines used during the test).   ? ?FOLLOW UP: ?Our staff will call the number listed on your records 48-72 hours following your procedure to check on you and address any questions or concerns that you may have regarding the information given to you following your procedure. If we do not reach you, we will leave a message.  We will attempt to reach you two times.  During this call, we will ask if you have developed any symptoms of COVID 19. If you develop any symptoms (ie: fever, flu-like symptoms, shortness of breath, cough etc.) before then, please call [phone].  If you test positive for Covid 19 in the 2 weeks post procedure, please call and report this information to [place].   ? ?If any biopsies were taken you will be contacted by phone or by letter within the next 1-3 weeks.  Please call us at [phone] if you have not heard about the biopsies in 3 weeks.  ? ? ?SIGNATURES/CONFIDENTIALITY: ?You and/or your care partner have signed paperwork which will be entered into your electronic medical record.  These signatures attest to the fact that that the information above on  your After Visit Summary has been reviewed and is understood.  Full responsibility of the confidentiality of this discharge information lies with you and/or your care-partner.  ? ? ?

## 2021-07-23 NOTE — Progress Notes (Signed)
Pt's states no medical or surgical changes since previsit or office visit. 

## 2021-07-27 ENCOUNTER — Telehealth: Payer: Self-pay | Admitting: *Deleted

## 2021-07-27 NOTE — Telephone Encounter (Signed)
?  Follow up Call- ? ? ?  07/23/2021  ?  9:46 AM  ?Call back number  ?Post procedure Call Back phone  # [phone]  ?Permission to leave phone message Yes  ?  ? ?Patient questions: ?Message left to call if necessary. ?

## 2021-07-27 NOTE — Telephone Encounter (Signed)
No answer at 2nd attempt follow up phone call.  Left message on voicemail.   ?

## 2021-08-04 ENCOUNTER — Encounter: Payer: Self-pay | Admitting: Internal Medicine

## 2021-09-16 ENCOUNTER — Encounter: Payer: 59 | Admitting: Family Medicine

## 2021-10-20 ENCOUNTER — Encounter: Payer: Self-pay | Admitting: Family Medicine

## 2021-12-10 ENCOUNTER — Other Ambulatory Visit (INDEPENDENT_AMBULATORY_CARE_PROVIDER_SITE_OTHER): Payer: No Typology Code available for payment source

## 2021-12-10 ENCOUNTER — Encounter: Payer: Self-pay | Admitting: Family Medicine

## 2021-12-10 ENCOUNTER — Ambulatory Visit (INDEPENDENT_AMBULATORY_CARE_PROVIDER_SITE_OTHER): Payer: No Typology Code available for payment source | Admitting: Family Medicine

## 2021-12-10 VITALS — BP 100/70 | HR 54 | Temp 98.3°F | Ht 71.0 in | Wt 211.2 lb

## 2021-12-10 DIAGNOSIS — Z0000 Encounter for general adult medical examination without abnormal findings: Secondary | ICD-10-CM

## 2021-12-10 DIAGNOSIS — Z23 Encounter for immunization: Secondary | ICD-10-CM

## 2021-12-10 DIAGNOSIS — Z125 Encounter for screening for malignant neoplasm of prostate: Secondary | ICD-10-CM | POA: Diagnosis not present

## 2021-12-10 DIAGNOSIS — E785 Hyperlipidemia, unspecified: Secondary | ICD-10-CM

## 2021-12-10 DIAGNOSIS — Z79899 Other long term (current) drug therapy: Secondary | ICD-10-CM | POA: Diagnosis not present

## 2021-12-10 DIAGNOSIS — Z131 Encounter for screening for diabetes mellitus: Secondary | ICD-10-CM

## 2021-12-10 LAB — CBC WITH DIFFERENTIAL/PLATELET
Basophils Absolute: 0 10*3/uL (ref 0.0–0.1)
Basophils Relative: 0.7 % (ref 0.0–3.0)
Eosinophils Absolute: 0 10*3/uL (ref 0.0–0.7)
Eosinophils Relative: 0.9 % (ref 0.0–5.0)
HCT: 46.2 % (ref 39.0–52.0)
Hemoglobin: 16.3 g/dL (ref 13.0–17.0)
Lymphocytes Relative: 38.7 % (ref 12.0–46.0)
Lymphs Abs: 1.4 10*3/uL (ref 0.7–4.0)
MCHC: 35.3 g/dL (ref 30.0–36.0)
MCV: 90 fl (ref 78.0–100.0)
Monocytes Absolute: 0.3 10*3/uL (ref 0.1–1.0)
Monocytes Relative: 7.7 % (ref 3.0–12.0)
Neutro Abs: 1.9 10*3/uL (ref 1.4–7.7)
Neutrophils Relative %: 52 % (ref 43.0–77.0)
Platelets: 130 10*3/uL — ABNORMAL LOW (ref 150.0–400.0)
RBC: 5.13 Mil/uL (ref 4.22–5.81)
RDW: 13.1 % (ref 11.5–15.5)
WBC: 3.6 10*3/uL — ABNORMAL LOW (ref 4.0–10.5)

## 2021-12-10 LAB — LIPID PANEL
Cholesterol: 224 mg/dL — ABNORMAL HIGH (ref 0–200)
HDL: 62.8 mg/dL (ref >39.00)
LDL Cholesterol: 139 mg/dL — ABNORMAL HIGH (ref 0–99)
NonHDL: 161.56
Total CHOL/HDL Ratio: 4
Triglycerides: 112 mg/dL (ref 0.0–149.0)
VLDL: 22.4 mg/dL (ref 0.0–40.0)

## 2021-12-10 LAB — HEPATIC FUNCTION PANEL
ALT: 35 U/L (ref 0–53)
AST: 25 U/L (ref 0–37)
Albumin: 4.5 g/dL (ref 3.5–5.2)
Alkaline Phosphatase: 43 U/L (ref 39–117)
Bilirubin, Direct: 0.3 mg/dL (ref 0.0–0.3)
Total Bilirubin: 2.2 mg/dL — ABNORMAL HIGH (ref 0.2–1.2)
Total Protein: 7.2 g/dL (ref 6.0–8.3)

## 2021-12-10 LAB — BASIC METABOLIC PANEL
BUN: 17 mg/dL (ref 6–23)
CO2: 31 mEq/L (ref 19–32)
Calcium: 9.7 mg/dL (ref 8.4–10.5)
Chloride: 102 mEq/L (ref 96–112)
Creatinine, Ser: 0.98 mg/dL (ref 0.40–1.50)
GFR: 85.5 mL/min (ref >60.00)
Glucose, Bld: 100 mg/dL — ABNORMAL HIGH (ref 70–99)
Potassium: 4.2 mEq/L (ref 3.5–5.1)
Sodium: 140 mEq/L (ref 135–145)

## 2021-12-10 NOTE — Progress Notes (Signed)
Timothy Sean T. Keona Sheffler, MD, [place] at [facility] [place], [address]  Phone: [phone]  FAX: [phone]  Timothy Rollins - 56 y.o. male  MRN [id number]  Date of Birth: 1965/11/08  Date: 12/10/2021  PCP: Owens Loffler, MD  Referral: Owens Loffler, MD  Chief Complaint  Patient presents with   Annual Exam   Patient Care Team: Owens Loffler, MD as PCP - General Subjective:   Timothy Rollins is a 56 y.o. pleasant patient who presents with the following:  Preventative Health Maintenance Visit:  Health Maintenance Summary Reviewed and updated, unless pt declines services.  Tobacco History Reviewed. Alcohol: No concerns, no excessive use Exercise Habits: 3-4 days a week, walks at lunch STD concerns: no risk or activity to increase risk Drug Use: None  Shingrix - no Covid booster - will get Flu -   Wt Readings from Last 3 Encounters:  12/10/21 211 lb 4 oz (95.8 kg)  07/23/21 210 lb (95.3 kg)  07/02/21 210 lb (95.3 kg)  Drinking "Next Beer"   Down from 224 2 years ago.  Feels better Lowest he hit was 204 Tracking his weight  Eating out a little bit more.   Health Maintenance  Topic Date Due   COVID-19 Vaccine (4 - Pfizer risk series) 04/21/2020   INFLUENZA VACCINE  06/20/2022 (Originally 10/20/2021)   Zoster Vaccines- Shingrix (2 of 2) 02/04/2022   TETANUS/TDAP  01/18/2023   COLONOSCOPY (Pts 45-61yr Insurance coverage will need to be confirmed)  07/24/2031   Hepatitis C Screening  Completed   HIV Screening  Completed   HPV VACCINES  Aged Out   Immunization History  Administered Date(s) Administered   Influenza,inj,Quad PF,6+ Mos 01/17/2013, 04/19/2018   PFIZER(Purple Top)SARS-COV-2 Vaccination 05/31/2019, 06/28/2019, 02/25/2020   Tdap 01/17/2013   Zoster Recombinat (Shingrix) 12/10/2021   Patient Active Problem List   Diagnosis Date Noted   Hx of colonic polyp 02/15/2016    Gilbert's syndrome    HYPERLIPIDEMIA 08/30/2009    Past Medical History:  Diagnosis Date   Gilbert's syndrome    Hx of colonic polyp 02/15/2016   Hyperlipidemia    on meds    Past Surgical History:  Procedure Laterality Date   COLONOSCOPY  2017   CG-MAC-miralax(good)-SSP   POLYPECTOMY  2017   SSP   VASECTOMY  2000   WISDOM TOOTH EXTRACTION  1980    Family History  Problem Relation Age of Onset   Cancer Father    Bone cancer Father    Lung cancer Father    Colon cancer Neg Hx    Esophageal cancer Neg Hx    Rectal cancer Neg Hx    Stomach cancer Neg Hx     Social History   Social History Narrative   Regular exercise-no          Past Medical History, Surgical History, Social History, Family History, Problem List, Medications, and Allergies have been reviewed and updated if relevant.  Review of Systems: Pertinent positives are listed above.  Otherwise, a full 14 point review of systems has been done in full and it is negative except where it is noted positive.  Objective:   BP 100/70   Pulse (!) 54   Temp 98.3 F (36.8 C) (Oral)   Ht _0  (1.803 m)   Wt 211 lb 4 oz (95.8 kg)   SpO2 98%   BMI 29.46 kg/m  Ideal Body Weight: Weight in (lb) to have BMI =  25: 178.9  Ideal Body Weight: Weight in (lb) to have BMI = 25: 178.9 No results found.    12/10/2021    8:38 AM 09/10/2020    9:01 AM 05/31/2019    8:31 AM 04/19/2018    8:24 AM 04/13/2017    8:37 AM  Depression screen PHQ 2/9  Decreased Interest 0 0 0 0 0  Down, Depressed, Hopeless 0 0 0 0 0  PHQ - 2 Score 0 0 0 0 0     GEN: well developed, well nourished, no acute distress Eyes: conjunctiva and lids normal, PERRLA, EOMI ENT: TM clear, nares clear, oral exam WNL Neck: supple, no lymphadenopathy, no thyromegaly, no JVD Pulm: clear to auscultation and percussion, respiratory effort normal CV: regular rate and rhythm, S1-S2, no murmur, rub or gallop, no bruits, peripheral pulses normal and symmetric,  no cyanosis, clubbing, edema or varicosities GI: soft, non-tender; no hepatosplenomegaly, masses; active bowel sounds all quadrants GU: deferred Lymph: no cervical, axillary or inguinal adenopathy MSK: gait normal, muscle tone and strength WNL, no joint swelling, effusions, discoloration, crepitus  SKIN: clear, good turgor, color WNL, no rashes, lesions, or ulcerations Neuro: normal mental status, normal strength, sensation, and motion Psych: alert; oriented to person, place and time, normally interactive and not anxious or depressed in appearance.  All labs reviewed with patient. Results for orders placed or performed in visit on 09/10/20  Testos,Total,Free and SHBG (Male)  Result Value Ref Range   Testosterone, Total, LC-MS-MS 606 250 - 1,100 ng/dL   Free Testosterone 50.9 35.0 - 155.0 pg/mL   Sex Hormone Binding 74 22 - 77 nmol/L  PSA, Total with Reflex to PSA, Free  Result Value Ref Range   PSA, Total 0.7 < OR = 4.0 ng/mL  Hemoglobin A1c  Result Value Ref Range   Hgb A1c MFr Bld 4.8 4.6 - 6.5 %  CBC with Differential/Platelet  Result Value Ref Range   WBC 3.6 (L) 4.0 - 10.5 K/uL   RBC 5.03 4.22 - 5.81 Mil/uL   Hemoglobin 16.1 13.0 - 17.0 g/dL   HCT 45.5 39.0 - 52.0 %   MCV 90.6 78.0 - 100.0 fl   MCHC 35.3 30.0 - 36.0 g/dL   RDW 13.2 11.5 - 15.5 %   Platelets 138.0 (L) 150.0 - 400.0 K/uL   Neutrophils Relative % 53.7 43.0 - 77.0 %   Lymphocytes Relative 37.0 12.0 - 46.0 %   Monocytes Relative 7.5 3.0 - 12.0 %   Eosinophils Relative 1.1 0.0 - 5.0 %   Basophils Relative 0.7 0.0 - 3.0 %   Neutro Abs 1.9 1.4 - 7.7 K/uL   Lymphs Abs 1.3 0.7 - 4.0 K/uL   Monocytes Absolute 0.3 0.1 - 1.0 K/uL   Eosinophils Absolute 0.0 0.0 - 0.7 K/uL   Basophils Absolute 0.0 0.0 - 0.1 K/uL  Basic metabolic panel  Result Value Ref Range   Sodium 139 135 - 145 mEq/L   Potassium 4.3 3.5 - 5.1 mEq/L   Chloride 103 96 - 112 mEq/L   CO2 30 19 - 32 mEq/L   Glucose, Bld 98 70 - 99 mg/dL   BUN  16 6 - 23 mg/dL   Creatinine, Ser 0.97 0.40 - 1.50 mg/dL   GFR 87.32 >60.00 mL/min   Calcium 10.0 8.4 - 10.5 mg/dL  Hepatic function panel  Result Value Ref Range   Total Bilirubin 3.1 (H) 0.2 - 1.2 mg/dL   Bilirubin, Direct 0.4 (H) 0.0 - 0.3 mg/dL  Alkaline Phosphatase 54 39 - 117 U/L   AST 27 0 - 37 U/L   ALT 36 0 - 53 U/L   Total Protein 7.4 6.0 - 8.3 g/dL   Albumin 4.8 3.5 - 5.2 g/dL  Lipid panel  Result Value Ref Range   Cholesterol 155 0 - 200 mg/dL   Triglycerides 154.0 (H) 0.0 - 149.0 mg/dL   HDL 46.90 >39.00 mg/dL   VLDL 30.8 0.0 - 40.0 mg/dL   LDL Cholesterol 77 0 - 99 mg/dL   Total CHOL/HDL Ratio 3    NonHDL 108.05   Hepatitis C antibody  Result Value Ref Range   Hepatitis C Ab NON-REACTIVE NON-REACTIVE   SIGNAL TO CUT-OFF 0.01 <1.00  HIV Antibody (routine testing w rflx)  Result Value Ref Range   HIV 1&2 Ab, 4th Generation NON-REACTIVE NON-REACTIVE    Assessment and Plan:     ICD-10-CM   1. Healthcare maintenance  Z00.00 Zoster Recombinant (Shingrix )    2. Screening for diabetes mellitus  Z13.1 PSA, Total with Reflex to PSA, Free    3. Hyperlipidemia, unspecified hyperlipidemia type  E78.5 Lipid panel    4. Screening PSA (prostate specific antigen)  Z12.5 Hemoglobin A1c    5. Encounter for long-term (current) use of medications  Z79.899 CBC with Differential/Platelet    Basic metabolic panel    Hepatic function panel    6. Need for shingles vaccine  Z23 Zoster Recombinant (Shingrix )     I congratulated him on the great job losing weight - BP is now normal Will recheck his cholesterol and consider going off of a statin.  Shingrix today  Health Maintenance Exam: The patient's preventative maintenance and recommended screening tests for an annual wellness exam were reviewed in full today. Brought up to date unless services declined.  Counselled on the importance of diet, exercise, and its role in overall health and mortality. The patient's FH and  SH was reviewed, including their home life, tobacco status, and drug and alcohol status.  Follow-up in 1 year for physical exam or additional follow-up below.  Disposition: No follow-ups on file.  No orders of the defined types were placed in this encounter.  There are no discontinued medications. Orders Placed This Encounter  Procedures   Zoster Recombinant (Shingrix )   CBC with Differential/Platelet   Basic metabolic panel   Hepatic function panel   Lipid panel   PSA, Total with Reflex to PSA, Free   Hemoglobin A1c    Signed,  Tauren Delbuono T. Quamere Mussell, MD   Allergies as of 12/10/2021   No Known Allergies      Medication List        Accurate as of December 10, 2021 10:25 AM. If you have any questions, ask your nurse or doctor.          atorvastatin 20 MG tablet Commonly known as: LIPITOR TAKE 1 TABLET BY MOUTH EVERY DAY   multivitamin tablet Take 1 tablet by mouth daily.   sildenafil 100 MG tablet Commonly known as: Viagra Take 0.5-1 tablets (50-100 mg total) by mouth daily as needed for erectile dysfunction.

## 2021-12-14 ENCOUNTER — Encounter: Payer: Self-pay | Admitting: Family Medicine

## 2021-12-14 LAB — PSA, TOTAL WITH REFLEX TO PSA, FREE: PSA, Total: 0.7 ng/mL (ref <=4.0)

## 2021-12-14 LAB — HEMOGLOBIN A1C: Hgb A1c MFr Bld: 4.8 % (ref 4.6–6.5)

## 2021-12-30 ENCOUNTER — Other Ambulatory Visit: Payer: Self-pay | Admitting: Family Medicine

## 2022-04-28 ENCOUNTER — Ambulatory Visit (INDEPENDENT_AMBULATORY_CARE_PROVIDER_SITE_OTHER): Payer: No Typology Code available for payment source | Admitting: *Deleted

## 2022-04-28 DIAGNOSIS — Z23 Encounter for immunization: Secondary | ICD-10-CM

## 2022-04-28 NOTE — Progress Notes (Signed)
Per orders of Dr. Lorelei Pont, injection of shingles vaccine given by Emelia Salisbury. Patient tolerated injection well.

## 2022-06-19 IMAGING — CT CT CHEST W/ CM
2 of 4 series · 15 of 36 positions shown, 18 images · IV contrast (OMNIPAQUE 300)
Comparison: Correlation made with chest radiograph 04/14/2020

CLINICAL DATA: Possible lung nodule on chest radiograph

EXAM:
CT CHEST WITH CONTRAST
TECHNIQUE: Multidetector CT imaging of the chest was performed during
intravenous contrast administration.
CONTRAST:  75mL OMNIPAQUE IOHEXOL 300 MG/ML  SOLN

[Series 2: thorax · axial · 0.79mm/px · z∈[-346,-76]mm · 12 of 161 slices shown, 15 images]
[im 13/161  mediastinal]
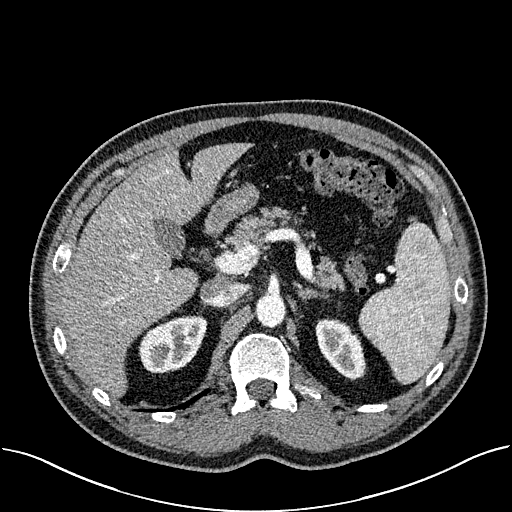
[im 13/161  lung]
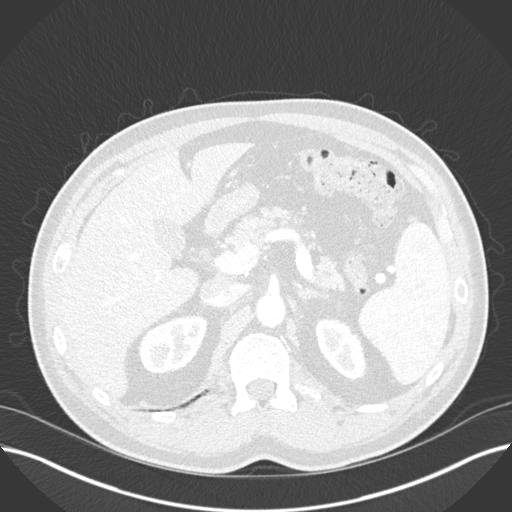
[im 25/161  lung]
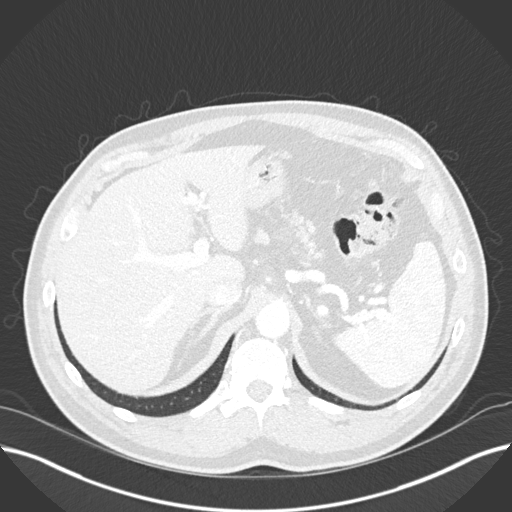
[im 37/161  lung]
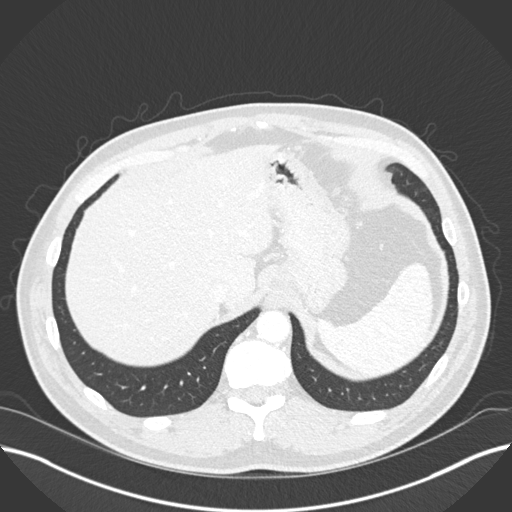
[im 50/161  lung]
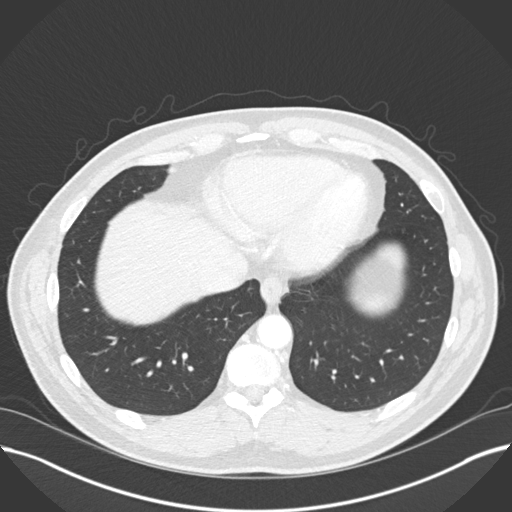
[im 62/161  mediastinal]
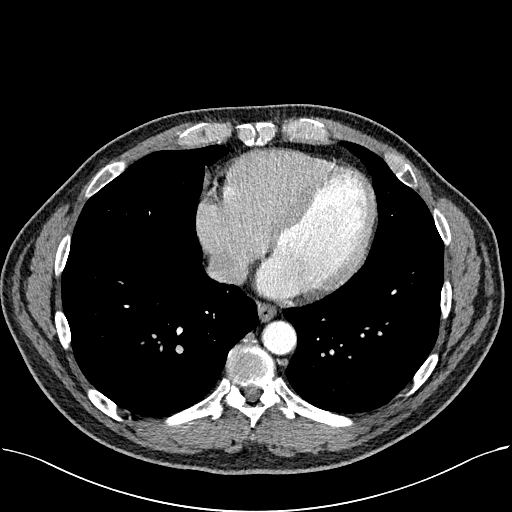
[im 62/161  lung]
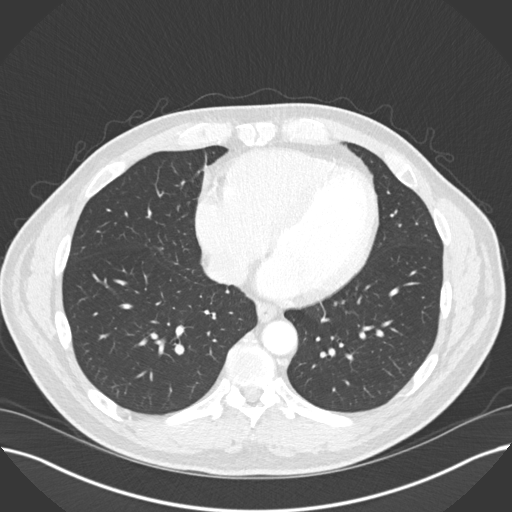
[im 74/161  lung]
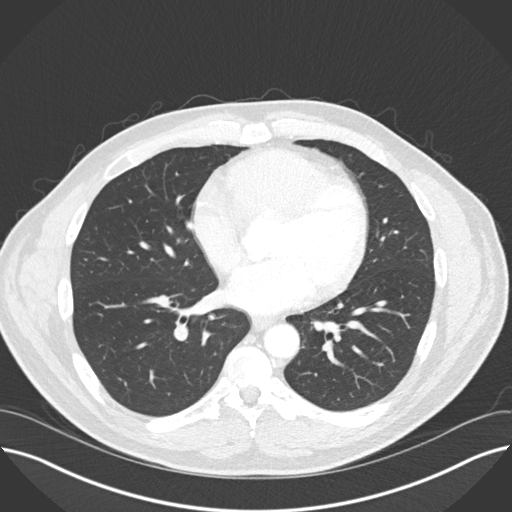
[im 87/161  lung]
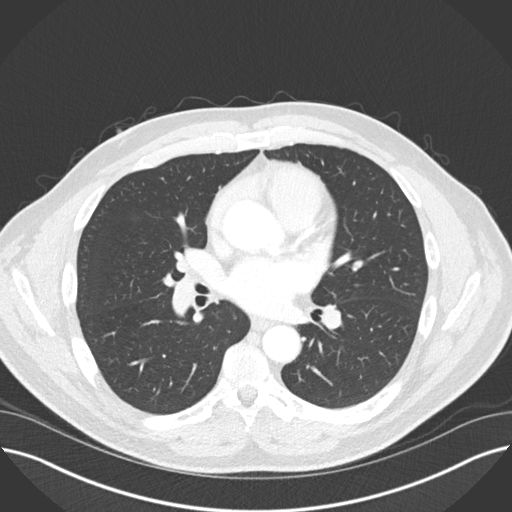
[im 99/161  lung]
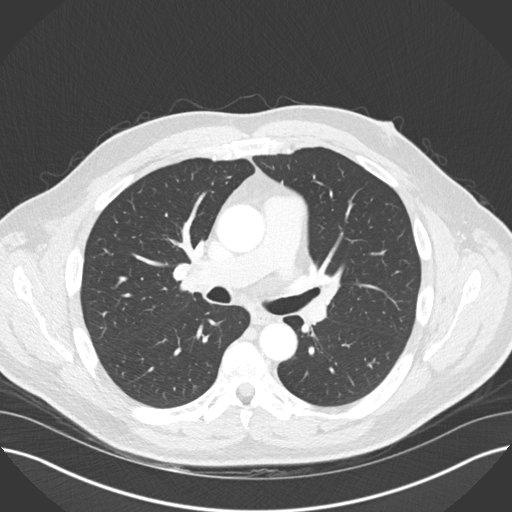
[im 111/161  mediastinal]
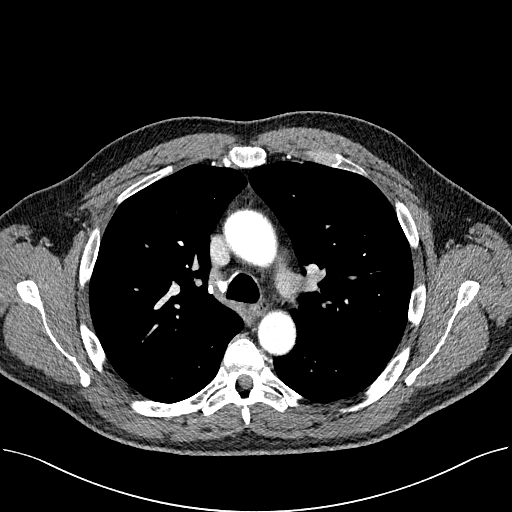
[im 111/161  lung]
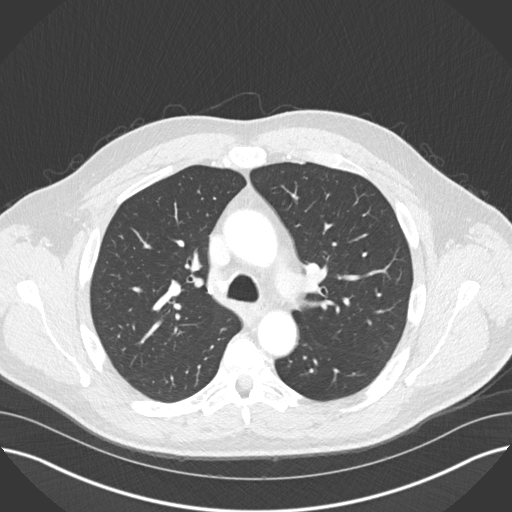
[im 124/161  lung]
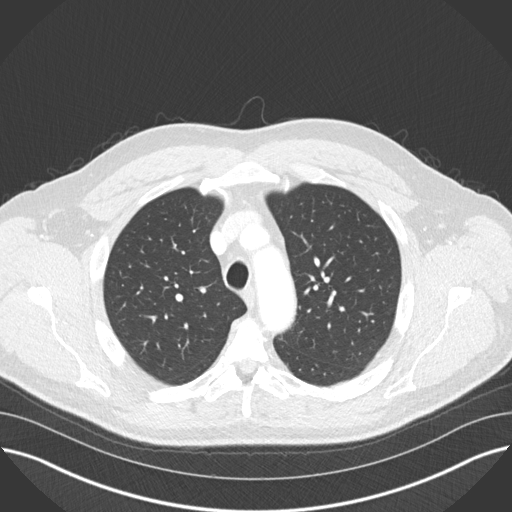
[im 136/161  lung]
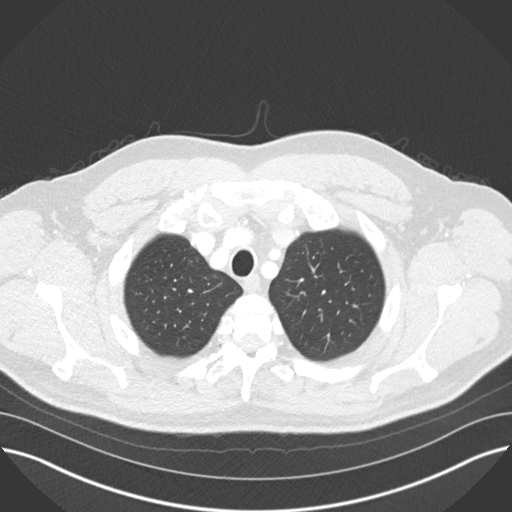
[im 148/161  lung]
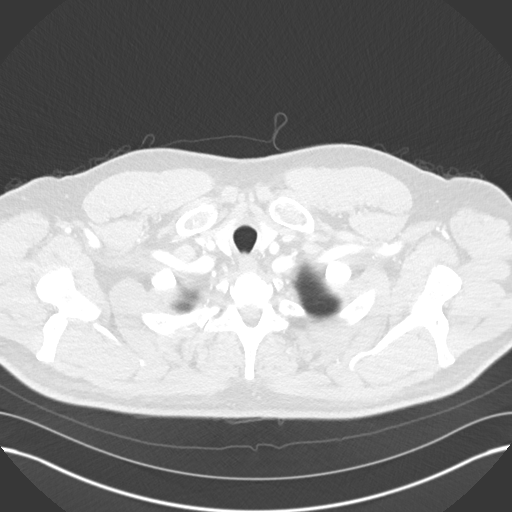

[Series 5: coronal · coronal · 0.66mm/px · 3 of 153 slices shown]
[im 31/153  lung]
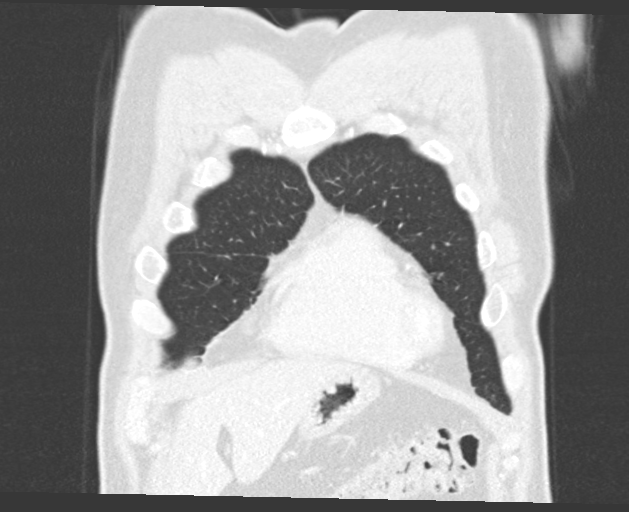
[im 61/153  lung]
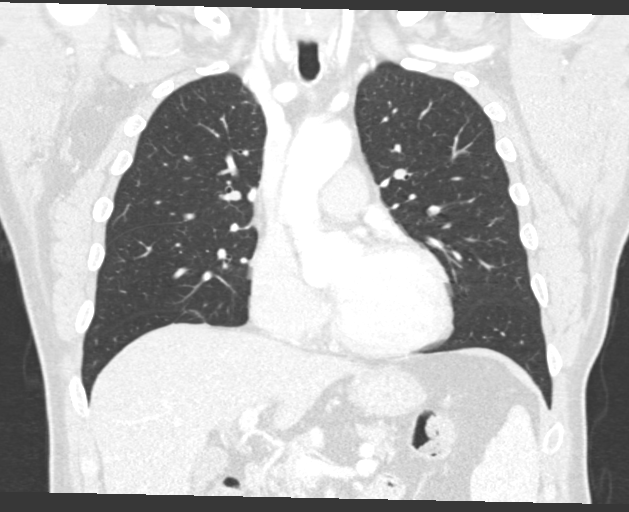
[im 92/153  lung]
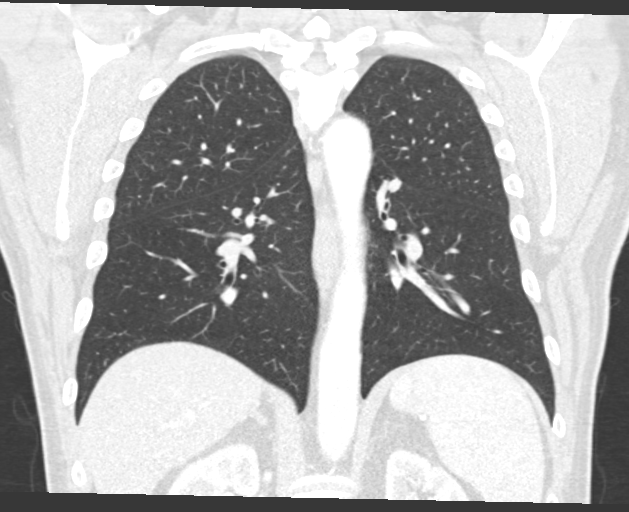

[15 of 36 positions shown; findings below may reference images not displayed]

FINDINGS: Cardiovascular: Normal heart size.  No pericardial effusion.

Mediastinum/Nodes: Thyroid and esophagus are unremarkable. There are
no enlarged lymph nodes identified.

Lungs/Pleura: There is no nodule identified. No consolidation. No
pleural effusion.

Upper Abdomen: No acute abnormality.

Musculoskeletal: No significant or acute osseous abnormality.
IMPRESSION: No nodule. Radiograph finding therefore likely reflects overlapping
structures.

## 2022-11-04 ENCOUNTER — Encounter (INDEPENDENT_AMBULATORY_CARE_PROVIDER_SITE_OTHER): Payer: Self-pay

## 2022-12-15 ENCOUNTER — Encounter: Payer: No Typology Code available for payment source | Admitting: Family Medicine

## 2023-03-10 ENCOUNTER — Other Ambulatory Visit: Payer: Self-pay | Admitting: Family Medicine

## 2023-03-10 NOTE — Telephone Encounter (Signed)
Please call and schedule CPE with fasting labs prior with Dr. Copland.  

## 2023-03-11 NOTE — Telephone Encounter (Signed)
Patient has been scheduled

## 2023-03-19 ENCOUNTER — Other Ambulatory Visit: Payer: Self-pay | Admitting: Family Medicine

## 2023-03-22 ENCOUNTER — Telehealth: Payer: Self-pay | Admitting: *Deleted

## 2023-03-22 DIAGNOSIS — E785 Hyperlipidemia, unspecified: Secondary | ICD-10-CM

## 2023-03-22 DIAGNOSIS — Z131 Encounter for screening for diabetes mellitus: Secondary | ICD-10-CM

## 2023-03-22 DIAGNOSIS — Z125 Encounter for screening for malignant neoplasm of prostate: Secondary | ICD-10-CM

## 2023-03-22 DIAGNOSIS — Z79899 Other long term (current) drug therapy: Secondary | ICD-10-CM

## 2023-03-22 NOTE — Telephone Encounter (Signed)
-----   Message from Alvina Chou sent at 03/21/2023  4:05 PM EST ----- Regarding: lab orders for [facility], 1.16.25 Patient is scheduled for CPX labs, please order future labs, Thanks , Camelia Eng

## 2023-03-25 ENCOUNTER — Other Ambulatory Visit: Payer: Self-pay | Admitting: Family Medicine

## 2023-04-07 ENCOUNTER — Other Ambulatory Visit (INDEPENDENT_AMBULATORY_CARE_PROVIDER_SITE_OTHER): Payer: 59

## 2023-04-07 DIAGNOSIS — Z79899 Other long term (current) drug therapy: Secondary | ICD-10-CM

## 2023-04-07 DIAGNOSIS — Z125 Encounter for screening for malignant neoplasm of prostate: Secondary | ICD-10-CM | POA: Diagnosis not present

## 2023-04-07 DIAGNOSIS — Z131 Encounter for screening for diabetes mellitus: Secondary | ICD-10-CM

## 2023-04-07 DIAGNOSIS — E785 Hyperlipidemia, unspecified: Secondary | ICD-10-CM | POA: Diagnosis not present

## 2023-04-07 LAB — CBC WITH DIFFERENTIAL/PLATELET
Basophils Absolute: 0 10*3/uL (ref 0.0–0.1)
Basophils Relative: 0.8 % (ref 0.0–3.0)
Eosinophils Absolute: 0 10*3/uL (ref 0.0–0.7)
Eosinophils Relative: 0.9 % (ref 0.0–5.0)
HCT: 49 % (ref 39.0–52.0)
Hemoglobin: 17.1 g/dL — ABNORMAL HIGH (ref 13.0–17.0)
Lymphocytes Relative: 37.4 % (ref 12.0–46.0)
Lymphs Abs: 1.5 10*3/uL (ref 0.7–4.0)
MCHC: 34.9 g/dL (ref 30.0–36.0)
MCV: 91.1 fL (ref 78.0–100.0)
Monocytes Absolute: 0.3 10*3/uL (ref 0.1–1.0)
Monocytes Relative: 8.2 % (ref 3.0–12.0)
Neutro Abs: 2.1 10*3/uL (ref 1.4–7.7)
Neutrophils Relative %: 52.7 % (ref 43.0–77.0)
Platelets: 137 10*3/uL — ABNORMAL LOW (ref 150.0–400.0)
RBC: 5.38 Mil/uL (ref 4.22–5.81)
RDW: 13.1 % (ref 11.5–15.5)
WBC: 3.9 10*3/uL — ABNORMAL LOW (ref 4.0–10.5)

## 2023-04-07 LAB — HEPATIC FUNCTION PANEL
ALT: 37 U/L (ref 0–53)
AST: 30 U/L (ref 0–37)
Albumin: 5.1 g/dL (ref 3.5–5.2)
Alkaline Phosphatase: 52 U/L (ref 39–117)
Bilirubin, Direct: 0.4 mg/dL — ABNORMAL HIGH (ref 0.0–0.3)
Total Bilirubin: 2.6 mg/dL — ABNORMAL HIGH (ref 0.2–1.2)
Total Protein: 7.4 g/dL (ref 6.0–8.3)

## 2023-04-07 LAB — BASIC METABOLIC PANEL
BUN: 18 mg/dL (ref 6–23)
CO2: 31 meq/L (ref 19–32)
Calcium: 9.8 mg/dL (ref 8.4–10.5)
Chloride: 102 meq/L (ref 96–112)
Creatinine, Ser: 0.99 mg/dL (ref 0.40–1.50)
GFR: 83.69 mL/min (ref >60.00)
Glucose, Bld: 99 mg/dL (ref 70–99)
Potassium: 4 meq/L (ref 3.5–5.1)
Sodium: 141 meq/L (ref 135–145)

## 2023-04-07 LAB — LIPID PANEL
Cholesterol: 168 mg/dL (ref 0–200)
HDL: 59 mg/dL (ref >39.00)
LDL Cholesterol: 86 mg/dL (ref 0–99)
NonHDL: 109.24
Total CHOL/HDL Ratio: 3
Triglycerides: 115 mg/dL (ref 0.0–149.0)
VLDL: 23 mg/dL (ref 0.0–40.0)

## 2023-04-07 LAB — TESTOSTERONE: Testosterone: 412.84 ng/dL (ref 300.00–890.00)

## 2023-04-07 LAB — HEMOGLOBIN A1C: Hgb A1c MFr Bld: 4.9 % (ref 4.6–6.5)

## 2023-04-07 NOTE — Addendum Note (Signed)
Addended by: Alvina Chou on: 04/07/2023 08:36 AM   Modules accepted: Orders

## 2023-04-11 LAB — PSA, TOTAL WITH REFLEX TO PSA, FREE: PSA, Total: 0.6 ng/mL (ref <=4.0)

## 2023-04-13 NOTE — Progress Notes (Unsigned)
Makaleigh Reinard T. Paxton Binns, MD, CAQ Sports Medicine [facility] at [facility] [address] [place], [address]  Phone: [phone]  FAX: [phone]  Timothy Rollins - 58 y.o. male  MRN [id number]  Date of Birth: 07-Sep-1965  Date: 04/14/2023  PCP: Hannah Beat, MD  Referral: Hannah Beat, MD  No chief complaint on file.  Patient Care Team: Hannah Beat, MD as PCP - General Subjective:   Timothy Rollins is a 58 y.o. pleasant patient who presents with the following:  Preventative Health Maintenance Visit:  Health Maintenance Summary Reviewed and updated, unless pt declines services.  Tobacco History Reviewed. Alcohol: No concerns, no excessive use Exercise Habits: Some activity, rec at least 30 mins 5 times a week STD concerns: no risk or activity to increase risk Drug Use: None  Flu Covid booster Tdap  At his lab draw, he had a perfectly normal testosterone.  White blood cell count was 3.9, minimally low which is similar to recent blood test, and he also has a mildly low platelet count, which has been persistent now for years.  Elevated bilirubin consistent with Gilbert's syndrome.    Health Maintenance  Topic Date Due   INFLUENZA VACCINE  10/21/2022   COVID-19 Vaccine (4 - 2024-25 season) 11/21/2022   DTaP/Tdap/Td (2 - Td or Tdap) 01/18/2023   Colonoscopy  07/24/2031   Hepatitis C Screening  Completed   HIV Screening  Completed   Zoster Vaccines- Shingrix  Completed   HPV VACCINES  Aged Out   Immunization History  Administered Date(s) Administered   Influenza,inj,Quad PF,6+ Mos 01/17/2013, 04/19/2018   PFIZER(Purple Top)SARS-COV-2 Vaccination 05/31/2019, 06/28/2019, 02/25/2020   Tdap 01/17/2013   Zoster Recombinant(Shingrix) 12/10/2021, 04/28/2022   Patient Active Problem List   Diagnosis Date Noted   Hx of colonic polyp 02/15/2016   Gilbert's syndrome    HYPERLIPIDEMIA 08/30/2009    Past Medical History:  Diagnosis  Date   Gilbert's syndrome    Hx of colonic polyp 02/15/2016   Hyperlipidemia    on meds    Past Surgical History:  Procedure Laterality Date   COLONOSCOPY  2017   CG-MAC-miralax(good)-SSP   POLYPECTOMY  2017   SSP   VASECTOMY  2000   WISDOM TOOTH EXTRACTION  1980    Family History  Problem Relation Age of Onset   Cancer Father    Bone cancer Father    Lung cancer Father    Colon cancer Neg Hx    Esophageal cancer Neg Hx    Rectal cancer Neg Hx    Stomach cancer Neg Hx     Social History   Social History Narrative   Regular exercise-no          Past Medical History, Surgical History, Social History, Family History, Problem List, Medications, and Allergies have been reviewed and updated if relevant.  Review of Systems: Pertinent positives are listed above.  Otherwise, a full 14 point review of systems has been done in full and it is negative except where it is noted positive.  Objective:   There were no vitals taken for this visit. Ideal Body Weight:    Ideal Body Weight:   No results found.    12/10/2021    8:38 AM 09/10/2020    9:01 AM 05/31/2019    8:31 AM 04/19/2018    8:24 AM 04/13/2017    8:37 AM  Depression screen PHQ 2/9  Decreased Interest 0 0 0 0 0  Down, Depressed, Hopeless 0 0 0 0  0  PHQ - 2 Score 0 0 0 0 0     GEN: well developed, well nourished, no acute distress Eyes: conjunctiva and lids normal, PERRLA, EOMI ENT: TM clear, nares clear, oral exam WNL Neck: supple, no lymphadenopathy, no thyromegaly, no JVD Pulm: clear to auscultation and percussion, respiratory effort normal CV: regular rate and rhythm, S1-S2, no murmur, rub or gallop, no bruits, peripheral pulses normal and symmetric, no cyanosis, clubbing, edema or varicosities GI: soft, non-tender; no hepatosplenomegaly, masses; active bowel sounds all quadrants GU: deferred Lymph: no cervical, axillary or inguinal adenopathy MSK: gait normal, muscle tone and strength WNL, no joint  swelling, effusions, discoloration, crepitus  SKIN: clear, good turgor, color WNL, no rashes, lesions, or ulcerations Neuro: normal mental status, normal strength, sensation, and motion Psych: alert; oriented to person, place and time, normally interactive and not anxious or depressed in appearance.  All labs reviewed with patient. Results for orders placed or performed in visit on 04/07/23  CBC with Differential/Platelet   Collection Time: 04/07/23  8:19 AM  Result Value Ref Range   WBC 3.9 (L) 4.0 - 10.5 K/uL   RBC 5.38 4.22 - 5.81 Mil/uL   Hemoglobin 17.1 (H) 13.0 - 17.0 g/dL   HCT 96.2 95.2 - 84.1 %   MCV 91.1 78.0 - 100.0 fl   MCHC 34.9 30.0 - 36.0 g/dL   RDW 32.4 40.1 - 02.7 %   Platelets 137.0 (L) 150.0 - 400.0 K/uL   Neutrophils Relative % 52.7 43.0 - 77.0 %   Lymphocytes Relative 37.4 12.0 - 46.0 %   Monocytes Relative 8.2 3.0 - 12.0 %   Eosinophils Relative 0.9 0.0 - 5.0 %   Basophils Relative 0.8 0.0 - 3.0 %   Neutro Abs 2.1 1.4 - 7.7 K/uL   Lymphs Abs 1.5 0.7 - 4.0 K/uL   Monocytes Absolute 0.3 0.1 - 1.0 K/uL   Eosinophils Absolute 0.0 0.0 - 0.7 K/uL   Basophils Absolute 0.0 0.0 - 0.1 K/uL  Hepatic Function Panel   Collection Time: 04/07/23  8:19 AM  Result Value Ref Range   Total Bilirubin 2.6 (H) 0.2 - 1.2 mg/dL   Bilirubin, Direct 0.4 (H) 0.0 - 0.3 mg/dL   Alkaline Phosphatase 52 39 - 117 U/L   AST 30 0 - 37 U/L   ALT 37 0 - 53 U/L   Total Protein 7.4 6.0 - 8.3 g/dL   Albumin 5.1 3.5 - 5.2 g/dL  Basic metabolic panel   Collection Time: 04/07/23  8:19 AM  Result Value Ref Range   Sodium 141 135 - 145 mEq/L   Potassium 4.0 3.5 - 5.1 mEq/L   Chloride 102 96 - 112 mEq/L   CO2 31 19 - 32 mEq/L   Glucose, Bld 99 70 - 99 mg/dL   BUN 18 6 - 23 mg/dL   Creatinine, Ser 2.53 0.40 - 1.50 mg/dL   GFR 66.44 >03.47 mL/min   Calcium 9.8 8.4 - 10.5 mg/dL  PSA, Total with Reflex to PSA, Free   Collection Time: 04/07/23  8:19 AM  Result Value Ref Range   PSA, Total  0.6 < OR = 4.0 ng/mL  Lipid panel   Collection Time: 04/07/23  8:19 AM  Result Value Ref Range   Cholesterol 168 0 - 200 mg/dL   Triglycerides 425.9 0.0 - 149.0 mg/dL   HDL 56.38 >75.64 mg/dL   VLDL 33.2 0.0 - 95.1 mg/dL   LDL Cholesterol 86 0 - 99 mg/dL  Total CHOL/HDL Ratio 3    NonHDL 109.24   Hemoglobin A1c   Collection Time: 04/07/23  8:19 AM  Result Value Ref Range   Hgb A1c MFr Bld 4.9 4.6 - 6.5 %  Testosterone   Collection Time: 04/07/23  8:19 AM  Result Value Ref Range   Testosterone 412.84 300.00 - 890.00 ng/dL    Assessment and Plan:     ICD-10-CM   1. Healthcare maintenance  Z00.00       Health Maintenance Exam: The patient's preventative maintenance and recommended screening tests for an annual wellness exam were reviewed in full today. Brought up to date unless services declined.  Counselled on the importance of diet, exercise, and its role in overall health and mortality. The patient's FH and SH was reviewed, including their home life, tobacco status, and drug and alcohol status.  Follow-up in 1 year for physical exam or additional follow-up below.  Disposition: No follow-ups on file.  No orders of the defined types were placed in this encounter.  There are no discontinued medications. No orders of the defined types were placed in this encounter.   Signed,  Elpidio Galea. Donette Mainwaring, MD   Allergies as of 04/14/2023   No Known Allergies      Medication List        Accurate as of April 13, 2023 10:30 AM. If you have any questions, ask your nurse or doctor.          atorvastatin 20 MG tablet Commonly known as: LIPITOR TAKE 1 TABLET BY MOUTH EVERY DAY   multivitamin tablet Take 1 tablet by mouth daily.   sildenafil 100 MG tablet Commonly known as: VIAGRA TAKE 1/2 TO 1 (ONE-HALF TO ONE) TABLET BY MOUTH ONCE DAILY AS NEEDED FOR ERECTILE DYSFUNCTION

## 2023-04-14 ENCOUNTER — Ambulatory Visit: Payer: 59 | Admitting: Family Medicine

## 2023-04-14 ENCOUNTER — Encounter: Payer: Self-pay | Admitting: Family Medicine

## 2023-04-14 VITALS — BP 108/80 | HR 62 | Temp 97.8°F | Ht 70.0 in | Wt 210.4 lb

## 2023-04-14 DIAGNOSIS — Z0000 Encounter for general adult medical examination without abnormal findings: Secondary | ICD-10-CM

## 2023-04-14 DIAGNOSIS — Z23 Encounter for immunization: Secondary | ICD-10-CM

## 2023-04-14 MED ORDER — ATORVASTATIN CALCIUM 20 MG PO TABS: 20.0000 mg | ORAL_TABLET | Freq: Every day | ORAL | 3 refills | Status: AC

## 2023-04-27 ENCOUNTER — Encounter: Payer: Self-pay | Admitting: *Deleted

## 2023-07-14 ENCOUNTER — Encounter: Payer: Self-pay | Admitting: Family Medicine

## 2023-08-09 NOTE — Progress Notes (Signed)
 Timothy Rollins T. Ernest Popowski, MD, CAQ Sports Medicine [facility] at [facility] [address] [place], [address]  Phone: [phone]  FAX: [phone]  Timothy Rollins - 58 y.o. male  MRN [id number]  Date of Birth: 22-Jul-1965  Date: 08/10/2023  PCP: Scherrie Curt, MD  Referral: Scherrie Curt, MD  Chief Complaint  Patient presents with   Shoulder Pain    Left   Neck Pain   Tingling    Left Arm   Subjective:   Timothy Rollins is a 58 y.o. very pleasant male patient with Body mass index is 29.9 kg/m. who presents with the following:  He is a very nice patient known for many years.  He presents with some ongoing neck and shoulder pain on the left side.  He has had some left-sided arm tingling.  Three weeks ago, got up and had some pain in the back of his neck. Following Friday, daughter had some gravel - 5 hours He currently does have an inducible left-sided cervical radiculopathy from myself Spurling's maneuver with rotation and extension at the neck.  He does not think that he has had any changes in strength or in his grip Does have some posterior shoulder blade pain He does have tingling, he does not have any associated numbness at the arm  Pain in the lateral shoulder Inducibible radicuopathy, L Mild ddd cervical, more lower  Review of Systems is noted in the HPI, as appropriate  Objective:   BP 118/80   Pulse 62   Temp 97.8 F (36.6 C) (Temporal)   Ht 5\' 10"  (1.778 m)   Wt 208 lb 6 oz (94.5 kg)   SpO2 98%   BMI 29.90 kg/m   Weight in (lb) to have BMI = 25: 173.9   GEN: No acute distress; alert,appropriate. PULM: Breathing comfortably in no respiratory distress PSYCH: Normally interactive.    CERVICAL SPINE EXAM Range of motion: Flexion, extension, lateral bending, and rotation: Mild limitation in forward flexion and extending, lateral bending and rotational maneuvers are more preserved, however he does have induction of  radicular pain with extension and rotation to the left Spurling's: Positive, left Pain with terminal motion: Yes Spinous Processes: NT SCM: NT Upper paracervical muscles: Tender to palpation on the left Upper traps: Tender to palpation on the left C5-T1 intact, sensation and motor    Shoulder: L Inspection: No muscle wasting or winging Ecchymosis/edema: neg  AC joint, scapula, clavicle: NT Abduction: full, 5/5 Flexion: full, 5/5 IR, full, lift-off: 5/5 ER at neutral: full, 5/5 AC crossover and compression: neg Neer: neg Hawkins: neg Drop Test: neg Jobe: neg Supraspinatus insertion: NT Bicipital groove: NT Speed's: neg Yergason's: neg Sulcus sign: neg Scapular dyskinesis: none  Laboratory and Imaging Data:  Assessment and Plan:     ICD-10-CM   1. Left cervical radiculopathy  M54.12 DG Cervical Spine Complete    2. Acute pain of left shoulder  M25.512 DG Cervical Spine Complete     He does have relatively mild cervical multilevel degenerative disc disease.  Think his symptoms are all from the neck referred to the shoulder and shoulder blade and then radicular pains down the left arm, as well.  Induction may have been caused by gravel work x 5 hours.  The patient start some McKenzie style protocol rehab and do a pulse of some steroids.  If his symptoms are persistent we could pursue things such as neuropathic pain agents and formal physical therapy.  Medication Management during  today's office visit: Meds ordered this encounter  Medications   predniSONE (DELTASONE) 20 MG tablet    Sig: 2 tabs po daily for 5 days, then 1 tab po daily for 5 days    Dispense:  15 tablet    Refill:  0   There are no discontinued medications.  Orders placed today for conditions managed today: Orders Placed This Encounter  Procedures   DG Cervical Spine Complete    Disposition: No follow-ups on file.  [employer] One speech-to-text software was used for transcription in  this dictation.  Possible transcriptional errors can occur using Animal nutritionist.   Signed,  Ranny Bye. Lynzi Meulemans, MD   Outpatient Encounter Medications as of 08/10/2023  Medication Sig   atorvastatin  (LIPITOR) 20 MG tablet Take 1 tablet (20 mg total) by mouth daily.   Multiple Vitamin (MULTIVITAMIN) tablet Take 1 tablet by mouth daily.   predniSONE (DELTASONE) 20 MG tablet 2 tabs po daily for 5 days, then 1 tab po daily for 5 days   sildenafil  (VIAGRA ) 100 MG tablet TAKE 1/2 TO 1 (ONE-HALF TO ONE) TABLET BY MOUTH ONCE DAILY AS NEEDED FOR ERECTILE DYSFUNCTION   No facility-administered encounter medications on file as of 08/10/2023.

## 2023-08-10 ENCOUNTER — Ambulatory Visit: Admitting: Family Medicine

## 2023-08-10 ENCOUNTER — Ambulatory Visit (INDEPENDENT_AMBULATORY_CARE_PROVIDER_SITE_OTHER)
Admission: RE | Admit: 2023-08-10 | Discharge: 2023-08-10 | Disposition: A | Discharge status: Home / Self Care | Source: Ambulatory Visit | Attending: Family Medicine | Admitting: Family Medicine

## 2023-08-10 ENCOUNTER — Encounter: Payer: Self-pay | Admitting: Family Medicine

## 2023-08-10 VITALS — BP 118/80 | HR 62 | Temp 97.8°F | Ht 70.0 in | Wt 208.4 lb

## 2023-08-10 DIAGNOSIS — M25512 Pain in left shoulder: Secondary | ICD-10-CM

## 2023-08-10 DIAGNOSIS — M5412 Radiculopathy, cervical region: Secondary | ICD-10-CM

## 2023-08-10 MED ORDER — PREDNISONE 20 MG PO TABS: ORAL_TABLET | ORAL | 0 refills | Status: DC

## 2023-08-18 ENCOUNTER — Ambulatory Visit: Payer: Self-pay | Admitting: Family Medicine

## 2023-09-20 NOTE — Progress Notes (Unsigned)
     Mariyam Remington T. Betty Daidone, MD, CAQ Sports Medicine [facility] at [facility] [address] [place], [address]  Phone: [phone]  FAX: [phone]  Timothy Rollins - 58 y.o. male  MRN [id number]  Date of Birth: 1965/12/17  Date: 09/21/2023  PCP: Watt Mirza, MD  Referral: Watt Mirza, MD  No chief complaint on file.  Subjective:   Timothy Rollins is a 58 y.o. very pleasant male patient with There is no height or weight on file to calculate BMI. who presents with the following:  Timothy Rollins is here with persistent neck and shoulder pain.  I saw him on Aug 10, 2023 left side cervical radiculopathy.  At that point I did some plain x-rays, and we also gave him a round of some oral steroids.    Review of Systems is noted in the HPI, as appropriate  Objective:   There were no vitals taken for this visit.  GEN: No acute distress; alert,appropriate. PULM: Breathing comfortably in no respiratory distress PSYCH: Normally interactive.   Laboratory and Imaging Data:  Assessment and Plan:   ***

## 2023-09-21 ENCOUNTER — Ambulatory Visit: Admitting: Family Medicine

## 2023-09-21 ENCOUNTER — Encounter: Payer: Self-pay | Admitting: Family Medicine

## 2023-09-21 VITALS — BP 110/80 | HR 54 | Temp 98.2°F | Ht 70.0 in | Wt 210.2 lb

## 2023-09-21 DIAGNOSIS — M5412 Radiculopathy, cervical region: Secondary | ICD-10-CM | POA: Diagnosis not present

## 2023-09-21 DIAGNOSIS — R202 Paresthesia of skin: Secondary | ICD-10-CM

## 2023-09-21 MED ORDER — GABAPENTIN 300 MG PO CAPS: 300.0000 mg | ORAL_CAPSULE | Freq: Three times a day (TID) | ORAL | 3 refills | Status: AC

## 2023-09-22 ENCOUNTER — Encounter: Payer: Self-pay | Admitting: Family Medicine

## 2023-10-10 ENCOUNTER — Ambulatory Visit: Attending: Family Medicine

## 2023-10-10 ENCOUNTER — Encounter: Payer: Self-pay | Admitting: Physical Therapy

## 2023-10-10 DIAGNOSIS — M5412 Radiculopathy, cervical region: Secondary | ICD-10-CM | POA: Diagnosis not present

## 2023-10-10 DIAGNOSIS — M542 Cervicalgia: Secondary | ICD-10-CM | POA: Diagnosis present

## 2023-10-10 NOTE — Therapy (Unsigned)
 OUTPATIENT PHYSICAL THERAPY CERVICAL EVALUATION   Patient Name: Timothy Rollins MRN: [id number] DOB:[birth year], 58 y.o., male Today's Date: 10/11/2023  END OF SESSION:  PT End of Session - 10/10/23 2115     Visit Number 1    Number of Visits 5    Date for PT Re-Evaluation 11/07/23    PT Start Time 1650    PT Stop Time 1730    PT Time Calculation (min) 40 min    Activity Tolerance Patient tolerated treatment well    Behavior During Therapy [facility] for tasks assessed/performed          Past Medical History:  Diagnosis Date   Gilbert's syndrome    Hx of colonic polyp 02/15/2016   Hyperlipidemia    Past Surgical History:  Procedure Laterality Date   COLONOSCOPY  2017   CG-MAC-miralax(good)-SSP   POLYPECTOMY  2017   SSP   VASECTOMY  2000   WISDOM TOOTH EXTRACTION  1980   Patient Active Problem List   Diagnosis Date Noted   Hx of colonic polyp 02/15/2016   Gilbert's syndrome    HYPERLIPIDEMIA 08/30/2009    PCP: N/A  REFERRING PROVIDER: Watt Mirza MD  REFERRING DIAG: M54.12 (ICD-10-CM) - Left cervical radiculopathy  THERAPY DIAG:  Cervicalgia  Rationale for Evaluation and Treatment: Rehabilitation  ONSET DATE: Late march, early April 2025  SUBJECTIVE:                                                                                                                                                                                                         SUBJECTIVE STATEMENT: Pt is a 58 y.o. male referred to OPPT for L sided neck pain and cervical radiculopathy Hand dominance: Right  PERTINENT HISTORY:  Pt had onset of L sided cervical radicular pain earlier this spring (Late March/early April 2025). Has onset of pain with facet narrowing positions like L cervical side bending and extension. No issues with rotation. Can't sleep on R side due to pillows pushing neck into L lateral flexion. Cervical flexion improves symptoms. Radicular pain appears to be median  nerve distribution to thumb and pointer finger. Worst pain reported as a < 5/10 NPS. Reports more of a nuisance. Has slowly improved. Is pain free when not putting head in provocative positions.   PAIN:  Are you having pain? Yes: NPRS scale: Mild (< 5/10 NPS) Pain location: L upper trap, LUE in median distribution Pain description: aching, sharp Aggravating factors: facet closing ranges of motion Relieving factors: cervical flexion, avoiding facet closing ranges  PRECAUTIONS: None  RED  FLAGS: None     WEIGHT BEARING RESTRICTIONS: No  FALLS:  Has patient fallen in last 6 months? No  LIVING ENVIRONMENT: Lives with: lives with their spouse  OCCUPATION: Art gallery manager  PLOF: Independent  PATIENT GOALS: Improve pain  NEXT MD VISIT: Unsure  OBJECTIVE:  Note: Objective measures were completed at Evaluation unless otherwise noted.  DIAGNOSTIC FINDINGS:  CLINICAL DATA:  Left cervical radiculopathy.   EXAM: CERVICAL SPINE - COMPLETE 4+ VIEW   COMPARISON:  None Available.   FINDINGS: Alignment is maintained. Vertebral body heights are normal. Anterior spurring from C4-C5 through C6-C7. Mild multilevel facet hypertrophy. Question of C3-C4 neural foraminal stenosis on the left. No evidence of fracture or focal bone abnormality. No prevertebral soft tissue thickening.   IMPRESSION: Mild multilevel degenerative disc disease and facet hypertrophy. Question of C3-C4 neural foraminal stenosis on the left.    PATIENT SURVEYS:  Neck Disability Index: deferred to next session due to time  COGNITION: Overall cognitive status: Within functional limits for tasks assessed  SENSATION: Light touch: WFL  POSTURE: rounded shoulders and forward head, cervical flexion C-C2 (nod)  PALPATION: TTP L upper trap. Concordant, active trigger point with palpation,   CERVICAL ROM:   Active ROM A/PROM (deg) eval  Flexion 75  Extension 40*  Right lateral flexion 45  Left lateral flexion  45*  Right rotation 55  Left rotation 50   (Blank rows = not tested)  UPPER EXTREMITY ROM: Full and painless in multi planar combined ranges (flexion, abduction, ER)/ (extension, adduction, IR)  UPPER EXTREMITY MMT:  MMT Right eval Left eval  Shoulder flexion    Shoulder extension    Shoulder abduction    Shoulder adduction    Shoulder extension    Shoulder internal rotation    Shoulder external rotation    Middle trapezius    Lower trapezius    Elbow flexion    Elbow extension    Wrist flexion    Wrist extension    Wrist ulnar deviation    Wrist radial deviation    Wrist pronation    Wrist supination    Grip strength     (Blank rows = not tested)  CERVICAL SPECIAL TESTS:  Spurling's test: Positive ULTT median nerve: positive  ULTT radial and ulnar nerves: negative  JOINT MOBILITY: CPA's and UPA's hypomobile C2-C7 but painless throughout  FUNCTIONAL TESTS:  L cervical side bending and extension reproduces L radicular symptoms in median nerve distribution  TREATMENT DATE: 10/10/23 Eval only                                                                                                                                 PATIENT EDUCATION:  Education details: HEP, POC, prognosis, anatomy of condition and how it relates to symptom management  Person educated: Patient Education method: Explanation, Demonstration, and Handouts Education comprehension: verbalized understanding and needs further education  HOME EXERCISE PROGRAM: Access Code: [email or web address] URL:  [email or web address] Date: 10/11/2023 Prepared by: Dorina Kingfisher  Exercises - Seated Cervical Retraction  - 1 x daily - 7 x weekly - 3 sets - 15 reps - Standing Median Nerve Glide  - 1 x daily - 7 x weekly - 1 sets - 3 reps  ASSESSMENT:  CLINICAL IMPRESSION: Patient is a 58 y.o. male who was seen today for physical therapy evaluation and treatment for cervical radiculopathy. Pt presents with  mild signs/symptoms consistent with median nerve distributed cervical radiculopathy such as sensation changes/ N/T with facet narrowing cervical ranges of motion and positive spurling's test and median nerve ULTT. Strength and BUE AROM intact. Pt with mild to moderate pain and diminished cervical ranges of motion that limit head turning ADL's such as driving, scanning environment. Pt in agreement to 1x/week for 4 weeks to address these deficits to maximize return to PLOF.  OBJECTIVE IMPAIRMENTS: decreased ROM, hypomobility, and pain.   ACTIVITY LIMITATIONS: sitting and sleeping  PARTICIPATION LIMITATIONS: driving, occupation, and yard work  PERSONAL FACTORS: Age, Past/current experiences, Profession, and Time since onset of injury/illness/exacerbation are also affecting patient's functional outcome.   REHAB POTENTIAL: Good  CLINICAL DECISION MAKING: Stable/uncomplicated  EVALUATION COMPLEXITY: Low   GOALS: Goals reviewed with patient? No  SHORT TERM GOALS: Target date: 10/24/23  Pt will be independent with HEP to improve cervical AROM and pain for return to pain free ADL's. Baseline: 10/10/22: provided initial HEP Goal status: INITIAL  LONG TERM GOALS: Target date: 11/07/23  Pt will improve NDI by at least 7.5 points to demonstrate clinically significant reduction disability due to neck pain.  Baseline: 10/10/23: deferred to next session Goal status: INITIAL  2.  Pt will improve cervical rotation bilaterally and extension by at least 7 degrees to demonstrate clinically significant improvement in painful ROM.  Baseline: 10/10/23:   Active ROM A/PROM (deg) eval  Flexion 75  Extension 40*  Right lateral flexion 45  Left lateral flexion 45*  Right rotation 55  Left rotation 50    Goal status: INITIAL  3.  Pt will report no LUE pain with cervical facet narrowing ranges of motion to demonstrate clinically significant improvement in radicular symptoms for yard work and return to job  tasks.  Baseline: 10/10/23: Has radicular pain with cervical ranges and LUE use  Goal status: INITIAL  PLAN:  PT FREQUENCY: 1-2x/week  PT DURATION: 4 weeks  PLANNED INTERVENTIONS: 97164- PT Re-evaluation, 97750- Physical Performance Testing, 97110-Therapeutic exercises, 97530- Therapeutic activity, [id number]- Neuromuscular re-education, 97535- Self Care, 02859- Manual therapy, [id number]- Electrical stimulation (unattended), [id number]- Electrical stimulation (manual), [id number]- Traction (mechanical), Patient/Family education, Taping, Joint mobilization, Joint manipulation, Spinal manipulation, Spinal mobilization, Cryotherapy, and Moist heat  PLAN FOR NEXT SESSION: Perform NDI, screen grip strength. Discuss possible TDN for L upper trap active trigger point. Cervical mobility, review/progress HEP   Dorina HERO. Fairly IV, PT, DPT Physical Therapist- [place]  [facility] 10/11/2023, 8:09 AM

## 2023-10-12 ENCOUNTER — Ambulatory Visit: Admitting: Physical Therapy

## 2023-10-18 ENCOUNTER — Ambulatory Visit: Admitting: Physical Therapy

## 2023-10-20 ENCOUNTER — Ambulatory Visit: Admitting: Physical Therapy

## 2023-10-24 ENCOUNTER — Encounter: Admitting: Physical Therapy

## 2023-10-26 ENCOUNTER — Encounter: Admitting: Physical Therapy

## 2023-10-31 NOTE — Therapy (Signed)
 OUTPATIENT PHYSICAL THERAPY TREATMENT   Patient Name: Timothy Rollins MRN: [id number] DOB:1965-12-21, 58 y.o., male Today's Date: 11/02/2023  END OF SESSION:  PT End of Session - 11/01/23 1802     Visit Number 2    Number of Visits 17    Date for PT Re-Evaluation 01/24/24    Authorization Type [employer] reporting period from 10/10/2023    PT Start Time 1126    PT Stop Time 1235    PT Time Calculation (min) 69 min    Activity Tolerance Patient tolerated treatment well    Behavior During Therapy [facility] for tasks assessed/performed           Past Medical History:  Diagnosis Date   Gilbert's syndrome    Hx of colonic polyp 02/15/2016   Hyperlipidemia    Past Surgical History:  Procedure Laterality Date   COLONOSCOPY  2017   CG-MAC-miralax(good)-SSP   POLYPECTOMY  2017   SSP   VASECTOMY  2000   WISDOM TOOTH EXTRACTION  1980   Patient Active Problem List   Diagnosis Date Noted   Hx of colonic polyp 02/15/2016   Gilbert's syndrome    HYPERLIPIDEMIA 08/30/2009    PCP: N/A  REFERRING PROVIDER: Watt Mirza MD  REFERRING DIAG: M54.12 (ICD-10-CM) - Left cervical radiculopathy  THERAPY DIAG:  Cervicalgia  Rationale for Evaluation and Treatment: Rehabilitation  ONSET DATE: Late march, early April 2025  SUBJECTIVE:                                                                                                                                                                                                         PERTINENT HISTORY:  Pt had onset of L sided cervical radicular pain earlier this spring (Late March/early April 2025). Has onset of pain with facet narrowing positions like L cervical side bending and extension. No issues with rotation. Can't sleep on R side due to pillows pushing neck into L lateral flexion. Cervical flexion improves symptoms. Radicular pain appears to be median nerve distribution to thumb and pointer finger. Worst pain reported as a <  5/10 NPS. Reports more of a nuisance. Has slowly improved. Is pain free when not putting head in provocative positions.   Patient states his condition started after he was shoveling gravel into a WB then picking up the WB and dumping it. After 10 dumps he was using the rake to smooth it out. He did not feel pain until the next morning and he felt he had a crick in his neck.  Exercise history: was an avid  gym goer in his youth, but has not had interest in going now.   SUBJECTIVE STATEMENT: Patient states his condition started after he was shoveling gravel into a WB then picking up the WB and dumping it. After 10 dumps he was using the rake to smooth it out. He did not feel pain until the next morning and he felt he had a crick in his neck. It has been getting better but he still has the same problem when he bends his head back or to the left.   PAIN:  Are you having pain?  Only when moving into cervical extension and L sidebending. Then it is paresthesia down L arm to thumb and pain at left side of neck to shoulder.   PRECAUTIONS: None  PATIENT GOALS: Improve pain  NEXT MD VISIT: Unsure  OBJECTIVE:   Vitals:   11/01/23 1133  BP: (!) 136/96  Pulse: (!) 59  SpO2: 99%  Manual, R arm, quiet pulse  NEUROLOGICAL DTR:  Biceps: R 1+ L 0 Triceps: R 1+, L 0 Wrist extensors: B 2+ Pronator B 0 Finger flexors B 1+  MYOTOMES Shoulder  External rotation (C5): B 4+/5 Elbow Flexion(C5/C6): R = 4+/5, L = 4/5. Extension (C7): R = 5/5, L = 5/5. Hand:  MCP ext (C7): 5/5 bilat Digiti mini (C8): bilat 5/5 Finger adduction (T1): 5/5  TRACTION ALLEVIATION Seated cervical distraction in slight extension: no difference Supine cervical distraction: 10x10 seconds on/off. Felt good but no significant change in side to side biceps strength.    SYMPTOM REGIONALIZATION TESTS Pain starts after movement of T1 when moving from flexion to extension starting with upper to lower cervical spine extension  starting in full cervical/thoracic flexion with thoracic extension blocked. (Suggests pain coming from upper thoracic spine).   Rotation: cervical spine L rotation in extension pain relieved when thoracic spine pre-rotated to right and held there (suggests pain coming from upper thoracic spine).   REPEATED MOTIONS TESTING Pretest symptoms (in sitting): pain and limited ROM with extension + L Sidebending  Test Movement Symptom During Symptom After Mechanical Response Key Functional Test (extension + L side bending)  Repeated Retraction in sitting 1x10 no effect no effect no effect No effect  Repeated Retraction in Sitting with self OP 1x10 no effect no effect no effect No effect  Repeated Retraction in Sitting with clinician OP, 1x4 produces worse decreased ROM worse  Repeated thoracic extension in seated with clinician OP 1x10 no effect no effect increased ROM better  Repeated thoracic extension in kneeling with self OP (elbows on plinth), 1x10 no effect, produces if elbows are not pulled in and as close to under chin as possible no effect no effect Worse than after previous test movement  Repeated thoracic extension in seated AROM 1x10 no effect better increased ROM Better (slight)     TREATMENT  Neuromuscular Re-education: a technique or exercise performed with the goal of improving the level of communication between the body and the brain, such as for balance, motor control, muscle activation patterns, coordination, desensitization, quality of muscle contraction, proprioception, and/or kinesthetic sense needed for successful and safe completion of functional activities.  Neurologic testing to further assess DTR, myotomes, and effect of traction alleviation to improve intervention options (see above).   Therapeutic exercise: therapeutic exercises that incorporate ONE  parameter at one or more areas of the body to centralize symptoms, develop strength and endurance, range of motion, and flexibility required for successful completion of functional activities. Vitals measurement for system's review  Repeated motions testing/treatment except clinician techniques (see objective section above)  Symptom regionalization testing (see above)  Education on HEP verbally removing nerve glide and adding seated thoracic extension AROM  Education on seated posture with trial of towel roll bolster. Education on load sensitivity and nerve tension sensitivity.   Manual therapy: to reduce pain and tissue tension, improve range of motion, neuromodulation, in order to promote improved ability to complete functional activities. HOOKLYING/SUPINE:  Manual Cervical Traction: 10x10 seconds on/off STM to bilateral UT/LS (Education on dry needling completed during manual therapy with recommendation that it is likely less indicated at this point due to lack of soft tissue reproduction of symptoms).   SEATED with towel roll behind lumbar spine Repeated thoracic extension with clinician OP: 1x10 (see repeated motions testing above).      Pt required multimodal cuing for proper technique and to facilitate improved neuromuscular control, strength, range of motion, and functional ability resulting in improved performance and form.   PATIENT EDUCATION:  Education details: Exercise purpose/form. Self management techniques. Education on diagnosis, prognosis, POC, anatomy and physiology of current condition. Education on HEP (verbal) Person educated: Patient Education method: Explanation, Demonstration, and Handouts Education comprehension: verbalized understanding and needs further education  HOME EXERCISE PROGRAM: Access Code: [email or web address] URL: [email or web address] Date: 11/01/2023 Prepared by: Camie Cleverly  Exercises - Seated Cervical Retraction  - 1 x daily - 7 x  weekly - 3 sets - 15 reps   HOME EXERCISE PROGRAM [[email or web address]]  Seated Thoracic Extension - 1x15 every 2-3 hours  ASSESSMENT:  CLINICAL IMPRESSION: Patient returns for first PT follow up session over 3 weeks after initial evaluation. Patient with similar deficits. Further examined neurologic deficits to track neuro vital signs and further classify sensitivities. He did not show load sensitivity (no obvious improvement in neuro signs following bout of supine cervical traction) but demonstrates position sensitivity with extension and L sidebending, and  neural tension sensitivity in the distribution of median nerve. Discontinued nerve glide in HEP to decrease irritation to nerve sensitivity. Symptom regionalization testing (for pain with extension and left rotation) suggest upper thoracic spine as source of pain but he also demonstrates C6 dermatomal and myotomal pattern. Patient responded with improved extension and L sidebending ROM with less pain with repeated thoracic extension AROM and with clinician OP in seated, so HEP was updated to prioritize this exercises throughout the day.  Plan to continue with education on mechanical stressors and avoidance of these to allow tissue to heal as well as re-assessment of and progression of repeated motions and manual techniques targeting the upper thoracic spine as tolerated and indicated. Patient's POC will most likely need to be extended as it expires in a few days and he has not been attending regularly as originally planned. Realistically based on tissue healing times and usual recovery times for this type  of condition, he will need more than 4 weeks of PT, so expect to extend POC at next session to allow for variability of healing. Continue to plan to help him improve and discharge from PT in quickest time possible. Patient would benefit from continued management of limiting condition by skilled physical therapist to address remaining impairments and functional  limitations to work towards stated goals and return to PLOF or maximal functional independence.     OBJECTIVE IMPAIRMENTS: decreased ROM, hypomobility, and pain.   ACTIVITY LIMITATIONS: sitting and sleeping  PARTICIPATION LIMITATIONS: driving, occupation, and yard work  PERSONAL FACTORS: Age, Past/current experiences, Profession, and Time since onset of injury/illness/exacerbation are also affecting patient's functional outcome.   REHAB POTENTIAL: Good  CLINICAL DECISION MAKING: Stable/uncomplicated  EVALUATION COMPLEXITY: Low   GOALS: Goals reviewed with patient? No  SHORT TERM GOALS: Target date: 10/24/23  Pt will be independent with HEP to improve cervical AROM and pain for return to pain free ADL's. Baseline: 10/10/22: provided initial HEP Goal status: MET  LONG TERM GOALS: Target date: 11/07/23.  Pt will improve NDI by at least 7.5 points to demonstrate clinically significant reduction disability due to neck pain.  Baseline: 10/10/23: deferred to next session. 11/01/23: not tested.  Goal status: In progress  2.  Pt will improve cervical rotation bilaterally and extension by at least 7 degrees to demonstrate clinically significant improvement in painful ROM.  Baseline: 10/10/23:   Active ROM A/PROM (deg) eval  Flexion 75  Extension 40*  Right lateral flexion 45  Left lateral flexion 45*  Right rotation 55  Left rotation 50    Goal status: In progress  3.  Pt will report no LUE pain with cervical facet narrowing ranges of motion to demonstrate clinically significant improvement in radicular symptoms for yard work and return to job tasks.  Baseline: 10/10/23: Has radicular pain with cervical ranges and LUE use  Goal status: In progress  PLAN:  PT FREQUENCY: 1-2x/week  PT DURATION: 4 weeks  PLANNED INTERVENTIONS: 97164- PT Re-evaluation, 97750- Physical Performance Testing, 97110-Therapeutic exercises, 97530- Therapeutic activity, [id number]- Neuromuscular re-education,  97535- Self Care, 02859- Manual therapy, [id number]- Electrical stimulation (unattended), [id number]- Electrical stimulation (manual), [id number]- Traction (mechanical), Patient/Family education, Taping, Joint mobilization, Joint manipulation, Spinal manipulation, Spinal mobilization, Cryotherapy, and Moist heat  PLAN FOR NEXT SESSION: Perform NDI, consider screen grip strength pre-and post cervical distraction or treatment. Thoracic mobility and cervical unloading techniques.    Camie SAUNDERS. Juli, PT, DPT, Cert. MDT 11/02/23, 10:15 AM  [facility] [address] [place], [place] [address] P: [phone] I F: [phone]

## 2023-11-01 ENCOUNTER — Ambulatory Visit: Attending: Family Medicine | Admitting: Physical Therapy

## 2023-11-01 ENCOUNTER — Encounter: Payer: Self-pay | Admitting: Physical Therapy

## 2023-11-01 VITALS — BP 136/96 | HR 59

## 2023-11-01 DIAGNOSIS — M542 Cervicalgia: Secondary | ICD-10-CM | POA: Insufficient documentation

## 2023-11-03 ENCOUNTER — Encounter: Admitting: Physical Therapy

## 2023-11-08 ENCOUNTER — Encounter: Admitting: Physical Therapy

## 2023-11-10 ENCOUNTER — Ambulatory Visit: Admitting: Physical Therapy

## 2023-11-15 ENCOUNTER — Encounter: Admitting: Physical Therapy

## 2023-11-17 ENCOUNTER — Ambulatory Visit: Admitting: Physical Therapy

## 2024-04-18 ENCOUNTER — Telehealth: Payer: Self-pay | Admitting: *Deleted

## 2024-04-18 DIAGNOSIS — E785 Hyperlipidemia, unspecified: Secondary | ICD-10-CM

## 2024-04-18 DIAGNOSIS — Z79899 Other long term (current) drug therapy: Secondary | ICD-10-CM

## 2024-04-18 DIAGNOSIS — Z131 Encounter for screening for diabetes mellitus: Secondary | ICD-10-CM

## 2024-04-18 DIAGNOSIS — Z125 Encounter for screening for malignant neoplasm of prostate: Secondary | ICD-10-CM

## 2024-04-18 NOTE — Telephone Encounter (Signed)
 I think that is fine.  He wanted it checked last year.

## 2024-04-18 NOTE — Telephone Encounter (Signed)
-----   Message from Harlene Du sent at 04/17/2024 12:42 PM EST ----- Regarding: Lab Wed 05/02/24 Hello,  Patient is coming in for CPE labs. Can we get orders please.   Thanks

## 2024-04-18 NOTE — Telephone Encounter (Signed)
 Do you want to recheck Testosterone  or not with CPE labs?  I ordered it.  Just let me know if I need to cancel it.

## 2024-05-02 ENCOUNTER — Other Ambulatory Visit: Payer: 59

## 2024-05-09 ENCOUNTER — Encounter: Payer: 59 | Admitting: Family Medicine
